# Patient Record
Sex: Male | Born: 1970 | Race: White | Hispanic: No | Marital: Married | Smoking: Never smoker
Health system: Southern US, Community
[De-identification: ages and names within clinical notes are randomized; demographics above are authoritative.]

## PROBLEM LIST (undated history)

## (undated) DIAGNOSIS — R413 Other amnesia: Secondary | ICD-10-CM

## (undated) DIAGNOSIS — I729 Aneurysm of unspecified site: Secondary | ICD-10-CM

## (undated) DIAGNOSIS — Q211 Atrial septal defect: Secondary | ICD-10-CM

## (undated) DIAGNOSIS — F419 Anxiety disorder, unspecified: Secondary | ICD-10-CM

## (undated) DIAGNOSIS — H539 Unspecified visual disturbance: Secondary | ICD-10-CM

## (undated) DIAGNOSIS — Q2112 Patent foramen ovale: Secondary | ICD-10-CM

## (undated) HISTORY — DX: Anxiety disorder, unspecified: F41.9

## (undated) HISTORY — DX: Patent foramen ovale: Q21.12

## (undated) HISTORY — PX: WISDOM TOOTH EXTRACTION: SHX21

## (undated) HISTORY — DX: Atrial septal defect: Q21.1

## (undated) HISTORY — DX: Aneurysm of unspecified site: I72.9

## (undated) HISTORY — DX: Other amnesia: R41.3

## (undated) HISTORY — DX: Unspecified visual disturbance: H53.9

---

## 2007-11-08 ENCOUNTER — Ambulatory Visit (HOSPITAL_COMMUNITY): Admission: RE | Admit: 2007-11-08 | Discharge: 2007-11-08 | Payer: Self-pay | Admitting: Orthopedic Surgery

## 2008-08-24 ENCOUNTER — Ambulatory Visit: Payer: Self-pay | Admitting: Sports Medicine

## 2008-08-24 DIAGNOSIS — M7660 Achilles tendinitis, unspecified leg: Secondary | ICD-10-CM | POA: Insufficient documentation

## 2008-09-30 ENCOUNTER — Ambulatory Visit: Payer: Self-pay | Admitting: Sports Medicine

## 2008-09-30 DIAGNOSIS — M722 Plantar fascial fibromatosis: Secondary | ICD-10-CM

## 2013-06-23 ENCOUNTER — Ambulatory Visit (INDEPENDENT_AMBULATORY_CARE_PROVIDER_SITE_OTHER): Payer: 59 | Admitting: Sports Medicine

## 2013-06-23 ENCOUNTER — Encounter: Payer: Self-pay | Admitting: Sports Medicine

## 2013-06-23 VITALS — BP 130/83 | HR 65 | Ht 72.0 in | Wt 190.0 lb

## 2013-06-23 DIAGNOSIS — M76899 Other specified enthesopathies of unspecified lower limb, excluding foot: Secondary | ICD-10-CM

## 2013-06-23 DIAGNOSIS — M25519 Pain in unspecified shoulder: Secondary | ICD-10-CM

## 2013-06-23 DIAGNOSIS — M6580 Other synovitis and tenosynovitis, unspecified site: Secondary | ICD-10-CM

## 2013-06-23 MED ORDER — NITROGLYCERIN 0.2 MG/HR TD PT24: 0.2000 mg | MEDICATED_PATCH | Freq: Every day | TRANSDERMAL | Status: DC

## 2013-06-23 NOTE — Progress Notes (Signed)
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 He states that his right posterior shoulder pain has been present intermittently for about 2 years. He states that the pain is really only present when he is lifting weights. He notes it is bothered most by chin-ups, horizontal rows, let allowed, and bicep curls with the bar. If he uses free weights he does not get the pain. He is able to do things like bench press and military press and pushups without pain. He notes that when he stopped doing chin-ups his pain improved but returned when he started to do these exercises again. He denies any associated numbness or tingling or pain traveling down the arm.  He also notices some left knee pain when he is lifting weights. He is doing talked squats and feels the pain at the superior pole of his patella at times when doing the squats. It is nontender bother him on his normal day-to-day activities.  ROS: As above in the HPI. All other systems are stable or negative.  OBJECTIVE: APPEARANCE:  Patient in no acute distress.The patient appeared well nourished and normally developed. HEENT: No scleral icterus. Conjunctiva non-injected Resp: Non labored Skin: No rash MSK:  Right Shoulder - No swelling or deformity - No TTP over AC joint, biceps tendon, or lateral humerus - FROM in flexion, abduction, internal, external rotation. He has mild comfort with extreme external rotation as he gets into endrange where he starts to involve his large shoulder muscles and scapular retractors. - Strength 5/5 on shoulder abduction, internal rotation, external rotation, empty can - Negative empty can, Hawkin's, Neer's for impingement - On palpation of the posterior shoulder inferior to the area of the rotator cuff there is a palpable cord of tissue at the location  of the patient's pain that is mildly tender to palpation - Neurovascularly intact  Left Knee - Inspection normal with no erythema or effusion or obvious bony abnormalities.  - Palpation normal with no warmth or joint line tenderness. Mild tenderness at the quad tendon at its insertion onto the superior pole of the patella - ROM normal in flexion and extension. - Strength 5/5 in flexion and extension. - Neurovascularly intact   MSK [place]: Limited ultrasound of the right posterior shoulder was performed today. The teres minor was identified at its approach to the humeral head. As this was followed more inferiorly away from the humeral head there was an area of hyperechoic and thickened tissue suggestive of fibrotic scar tissue lying between the planes of 2 muscles. This is suggestive of tear between the 2 muscle bellies with fibrotic healing. Left quadriceps tendon was also visualized today. There was evidence of multiple hyperechoic calcifications protruding into the quadriceps tendon as well as some spurring off of the superior patella  ASSESSMENT: #1. 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 Right posterior shoulder pain suspect tear of the fascia between muscle bellies with resultant fibrotic scarring  PLAN: #1. For quadriceps tendinopathy, patient was given an exercise program to focus on declined squats. He was also instructed to decrease the weight on his squats and to potentially decrease the depth of his squats to avoid placing additional strain on quadriceps tendon risking rupture. We may need to consider nitroglycerin that in the future if this persists.  #2. For posterior shoulder pain, we  will start him on a nitroglycerin patch. He was asked to modify his exercises to lower weight such that he can start to work on the exercises that previously caused pain but to keep this below a threshold of pain. We will see him back in 6 weeks  for recheck.

## 2013-06-23 NOTE — Patient Instructions (Signed)
Thank you for coming in today  1. Left knee pain - quadriceps calcific tendinopathy - Decrease weight on squats - Keeps squats to 60 degrees - Decline squats 3 x 15  2. Right shoulder pain - it looks like you have torn the tissues between the two muscles and this has healed with excess scar tissue - we want to avoid reinjury - try nitro patch  Nitroglycerin Protocol   Apply 1/4 nitroglycerin patch to affected area daily.  Change position of patch within the affected area every 24 hours.  You may experience a headache during the first 1-2 weeks of using the patch, these should subside.  If you experience headaches after beginning nitroglycerin patch treatment, you may take your preferred over the counter pain reliever.  Another side effect of the nitroglycerin patch is skin irritation or rash related to patch adhesive.  Please notify our office if you develop more severe headaches or rash, and stop the patch.  Tendon healing with nitroglycerin patch may require 12 to 24 weeks depending on the extent of injury.  Men should not use if taking Viagra, Cialis, or Levitra.   Do not use if you have migraines or rosacea.

## 2013-08-17 ENCOUNTER — Ambulatory Visit: Payer: 59 | Admitting: Sports Medicine

## 2013-09-15 ENCOUNTER — Ambulatory Visit (INDEPENDENT_AMBULATORY_CARE_PROVIDER_SITE_OTHER): Payer: 59 | Admitting: Sports Medicine

## 2013-09-15 ENCOUNTER — Encounter: Payer: Self-pay | Admitting: Sports Medicine

## 2013-09-15 VITALS — BP 124/70 | Ht 72.0 in | Wt 190.0 lb

## 2013-09-15 DIAGNOSIS — M76899 Other specified enthesopathies of unspecified lower limb, excluding foot: Secondary | ICD-10-CM

## 2013-09-15 DIAGNOSIS — M25511 Pain in right shoulder: Secondary | ICD-10-CM | POA: Insufficient documentation

## 2013-09-15 DIAGNOSIS — M6580 Other synovitis and tenosynovitis, unspecified site: Secondary | ICD-10-CM

## 2013-09-15 DIAGNOSIS — M25519 Pain in unspecified shoulder: Secondary | ICD-10-CM

## 2013-09-15 NOTE — Progress Notes (Signed)
Patient ID: Dylan Rosario, male   DOB: 05/21/1971, 43 y.o.   MRN: [id number]  Patient was seen for hyperechoic scar tissue in the right infraspinatus thought to be related to certain weight lifting exercises  He modified his weight lifting. He has been using nitroglycerin. Within a week or so of starting nitroglycerin he felt much less pain and has had continued improvement where he now has no pain in the right.  Of interest the left shoulder have some pain over the posterior capsule now  Left quadriceps is now nonpainful. He stopped taking his squats below about 60 and also decrease the weight  With this he has no pain  No acute distress  RT Shoulder: Inspection reveals no abnormalities, atrophy or asymmetry. Palpation is normal with no tenderness over AC joint or bicipital groove. ROM is full in all planes. Rotator cuff strength normal throughout. No signs of impingement with negative Neer and Hawkin's tests, empty can. Speeds and Yergason's tests normal. No labral pathology noted with negative Obrien's, negative clunk and good stability. Normal scapular function observed. No painful arc and no drop arm sign. No apprehension sign  LT Knee: Normal to inspection with no erythema or effusion or obvious bony abnormalities. Palpation normal with no warmth or joint line tenderness or patellar tenderness or condyle tenderness. ROM normal in flexion and extension and lower leg rotation. Ligaments with solid consistent endpoints including ACL, PCL, LCL, MCL. Negative Mcmurray's and provocative meniscal tests. Non painful patellar compression. Patellar and quadriceps tendons unremarkable. Hamstring and quadriceps strength is normal.  Ultrasound The area hypoechoic change in the infraspinatus distal tendon of the right has now resolved The rotator cuff tendons look normal on the right Rotator cuff tendons of left look normal as well  Left knee Quadriceps tendon shows some  calcification in the rectus femoris. There is a fairly significant spur in the vastus lateralis. A smaller spur noted in the vastus medialis. There is much less hypoechoic change on this exam  Right knee shows a significant spur in the rectus femoris tendon -- not in other areas No hypoechoic change

## 2013-09-15 NOTE — Assessment & Plan Note (Signed)
This is not painful at present. I suggested continuing to limit the degree of knee flexion so this does not become symptomatic

## 2013-09-15 NOTE — Assessment & Plan Note (Signed)
In the right shoulder since the infraspinatus now looks normal I think he can wean off the nitroglycerin over the next 2 weeks  Continue to modify his workouts so that he does not get pain along the posterior capsule of the the left or right tendon   His rotator cuff looks healthy on scanning

## 2013-12-20 DIAGNOSIS — D696 Thrombocytopenia, unspecified: Secondary | ICD-10-CM | POA: Insufficient documentation

## 2014-04-27 DIAGNOSIS — R55 Syncope and collapse: Secondary | ICD-10-CM | POA: Insufficient documentation

## 2014-07-20 ENCOUNTER — Other Ambulatory Visit: Payer: 59

## 2014-07-20 ENCOUNTER — Ambulatory Visit (INDEPENDENT_AMBULATORY_CARE_PROVIDER_SITE_OTHER): Payer: 59 | Admitting: Neurology

## 2014-07-20 ENCOUNTER — Encounter: Payer: Self-pay | Admitting: Neurology

## 2014-07-20 VITALS — BP 146/78 | HR 62 | Resp 12 | Ht 73.0 in | Wt 186.2 lb

## 2014-07-20 DIAGNOSIS — R251 Tremor, unspecified: Secondary | ICD-10-CM | POA: Insufficient documentation

## 2014-07-20 DIAGNOSIS — R413 Other amnesia: Secondary | ICD-10-CM | POA: Insufficient documentation

## 2014-07-20 DIAGNOSIS — R42 Dizziness and giddiness: Secondary | ICD-10-CM | POA: Insufficient documentation

## 2014-07-20 NOTE — Progress Notes (Signed)
[facility]  PATIENT: Dylan Rosario DOB: [birth year]  REFERRING DOCTOR OR PCP:  Jesse Fall SOURCE: Patient  _________________________________   HISTORICAL  CHIEF COMPLAINT:  Chief Complaint  Patient presents with  . Memory Loss    [name] is here for eval of  intermittent difficulty with memory, speech, mild tremor bilat hands when trying to grasp something small.  Onset 2014.  Sts. CT head/chest done in [facility] in 2014 was neg. Has not had any other studies./fim    HISTORY OF PRESENT ILLNESS:  I had the pleasure seeing your patient, [name] at [facility] for a neurologic consultation regarding his episode of pre-syncope and memory loss 12/16/2012 and subsequent symptoms. On that day, [place] the beach [place] 3 hours. He recalls that he drank some water while there. On his way back to the resort, he felt a little strange but felt well enough to go to the gym where he started a workout. Halfway through the workout he could not remember what he had done during the first half of the workout which is unusual for him. He stopped and went back to his room. However, he could not remember which room he was in and needed to use GPS to find his way back. Because of those symptoms he decided to go to an urgent care center. While his wife was driving him to the urgent care, he felt lightheaded like he was about to pass out. They sore a fire truck nearby so he had them check his vital signs. His pulse was little elevated and blood pressure was a little reduced. After a little bit longer time he felt the lightheadedness was improved. He went by ambulance to the [facility] emergency room.   He was concerned about a TIA or stroke as his mom had a TIA. While in the ambulance, he had difficulty remembering his address and phone number and other inflormation.   He received IV fluids in the ambulance and first urinated a few hours later.   A head  CT and chest CT were reportedly negative.  In the emergency room, he was told that he might have had transient global amnesia.  When back in town, he saw his PCP office.   He was told BUN was elevated in the ER that episode and he was likely dehydrated.   Three weeks later, he had a short episode of word finding difficulty without other symptoms.    He has noticed a few episodes of tremors in his hands that seemed worse after a bad night's sleep.  He has had a few episodes of lightheadedness when leaning over with head down.   He had several vasovagal episodes with near syncope associated with yawning followed by lightheadedness.  He sat down and felt better.  One episode was while D & C was being explained to wife after miscarriage 10 years ago and two other associated with football and a 1/2 marathon when younger.   He had one other episode after blood drawing.  He feels a little foggy - less cognitively sharp off and on, often after a big meal or if he was very hungry before a meal. .  Episodes lasts 30 minutes and he is then back to baseline  He has occasional nights where he works until 3 am and then still wakes up early.  He is more likely to feel less alert and have hand shaking if that happens.  REVIEW OF SYSTEMS: Constitutional: No fevers, chills, sweats, or change in appetite Eyes: No visual changes, double vision, eye pain Ear, nose and throat: No hearing loss, ear pain, nasal congestion, sore throat Cardiovascular: No chest pain, palpitations Respiratory: No shortness of breath at rest or with exertion.   No wheezes GastrointestinaI: No nausea, vomiting, diarrhea, abdominal pain, fecal incontinence Genitourinary: No dysuria, urinary retention or frequency.  No nocturia. Musculoskeletal: No neck pain, back pain Integumentary: No rash, pruritus, skin lesions Neurological: as above Psychiatric: No depression at this time.  No anxiety Endocrine: No palpitations, diaphoresis,  change in appetite, change in weigh or increased thirst Hematologic/Lymphatic: No anemia, purpura, petechiae. Allergic/Immunologic: No itchy/runny eyes, nasal congestion, recent allergic reactions, rashes  ALLERGIES: Allergies  Allergen Reactions  . Penicillins Diarrhea    HOME MEDICATIONS:  Current outpatient prescriptions:  .  Ascorbic Acid (VITAMIN C) 1000 MG tablet, Take 1,000 mg by mouth., Disp: , Rfl:  .  Desoximetasone 0.05 % GEL, Apply topically Two (2) times a day., Disp: , Rfl:  .  ketoconazole (NIZORAL) 2 % shampoo, as needed., Disp: , Rfl:  .  nitroGLYCERIN (NITRODUR - DOSED IN MG/24 HR) 0.2 mg/hr patch, Place 1 patch (0.2 mg total) onto the skin daily., Disp: 30 patch, Rfl: 1 .  Omega-3 1000 MG CAPS, Take by mouth., Disp: , Rfl:   PAST MEDICAL HISTORY: Past Medical History  Diagnosis Date  . Memory loss   . Vision abnormalities     PAST SURGICAL HISTORY: Past Surgical History  Procedure Laterality Date  . Wisdom tooth extraction      FAMILY HISTORY: Family History  Problem Relation Age of Onset  . Stroke Mother   . Lung cancer Father     SOCIAL HISTORY:  History   Social History  . Marital Status: Married    Spouse Name: N/A  . Number of Children: N/A  . Years of Education: N/A   Occupational History  . Not on file.   Social History Main Topics  . Smoking status: Never Smoker   . Smokeless tobacco: Never Used  . Alcohol Use: 0.0 oz/week    0 Standard drinks or equivalent per week     Comment: 1/2 glass wine daily  . Drug Use: No  . Sexual Activity: Not on file   Other Topics Concern  . Not on file   Social History Narrative     PHYSICAL EXAM  Filed Vitals:   07/20/14 1048  BP: 146/78  Pulse: 62  Resp: 12  Height: 6\' 1"  (1.854 m)  Weight: 186 lb 3.2 oz (84.46 kg)    Body mass index is 24.57 kg/(m^2).   General: The patient is well-developed and well-nourished and in no acute distress  Eyes:  Funduscopic exam shows normal  optic discs and retinal vessels.  Neck: The neck is supple, no carotid bruits are noted.  The neck is nontender.  Cardiovascular: The heart has a regular rate and rhythm with a normal S1 and S2. There were no murmurs, gallops or rubs. Lungs are clear to auscultation.  Skin: Extremities are without significant edema.  Musculoskeletal:  Back is nontender  Neurologic Exam  Mental status: The patient is alert and oriented x 3 at the time of the examination. The patient has apparent normal recent and remote memory, with an apparently normal attention span and concentration ability.   Speech is normal.  Cranial nerves: Extraocular movements are full. Pupils are equal, round, and reactive to light and accomodation.  Visual fields are full.  Facial symmetry is present. There is good facial sensation to soft touch bilaterally.Facial strength is normal.  Trapezius and sternocleidomastoid strength is normal. No dysarthria is noted.  The tongue is midline, and the patient has symmetric elevation of the soft palate. No obvious hearing deficits are noted.  Motor:  Muscle bulk is normal.   Tone is normal. Strength is  5 / 5 in all 4 extremities.   Sensory: Sensory testing is intact to pinprick, soft touch and vibration sensation in all 4 extremities.  Coordination: Cerebellar testing reveals good finger-nose-finger and heel-to-shin bilaterally.  Gait and station: Station is normal.   Gait is normal. Tandem gait is normal. Romberg is negative.   Reflexes: Deep tendon reflexes are symmetric and normal bilaterally.   Plantar responses are flexor.    DIAGNOSTIC DATA (LABS, IMAGING, TESTING) - I reviewed patient records, labs, notes, testing and imaging myself where available.     ASSESSMENT AND PLAN  Memory loss of - Plan: MR Brain W Wo Contrast  Lightheaded - Plan: MR Brain W Wo Contrast  Tremor   In summary, Mr. Darin Engels is a 44 year old man who had an episode in July 2014 of  lightheadedness, memory changes and confusion. Since then he has had several other short episodes of the memory loss, tremors or word finding difficulty. Although he was diagnosed with transient global amnesia, the events of July 2014 do not really resemble that diagnosis. I believe more likely he had presyncope and a confusional episode due to mild dehydration, possible vasovagal reaction and possibly anxiety. Since that he has had several other episodes that are mostly short acting with mild neurologic symptoms. The etiology is not clear but I will go ahead and order an MRI of the brain with and without contrast to make sure that there is not any ischemic or inflammatory or structural explanation. Based on the findings of the MRI, further blood work or other tests may need to be ordered. I will call him back with the results and discuss that with him.  I will see Mr. McCorguodale back as needed and he is advised to call if he has any new or worsening neurologic symptoms.   Thank you very much for asking me to see Mr. McCorguodale for a neurologic consultation. Please let know I can be of any further assistance with him or other patients in the future.   Ludia Gartland A. Felecia Shelling, MD, PhD 2/77/8242, 35:36 AM Certified in Neurology, Clinical Neurophysiology, Sleep Medicine, Pain Medicine and Neuroimaging  [facility] [address], [place], [place] [address] [phone]

## 2014-07-25 ENCOUNTER — Encounter: Payer: Self-pay | Admitting: Family Medicine

## 2014-07-25 ENCOUNTER — Ambulatory Visit (INDEPENDENT_AMBULATORY_CARE_PROVIDER_SITE_OTHER): Payer: 59 | Admitting: Family Medicine

## 2014-07-25 VITALS — BP 119/79 | HR 75 | Ht 73.0 in | Wt 185.0 lb

## 2014-07-25 DIAGNOSIS — M25511 Pain in right shoulder: Secondary | ICD-10-CM

## 2014-07-25 NOTE — Progress Notes (Signed)
Patient ID: Dylan Rosario, male   DOB: [birth year], 44 y.o.   MRN: [id number]  Dylan Rosario - 44 y.o. male MRN [id number]  Date of birth: 15-Jan-1971    SUBJECTIVE:     Right shoulder pain. Very similar to issues he was having last April. At that time he used the nitroglycerin patch and had total resolution of his symptoms. Last week he had normal workout with weights on Thursday, no pain on Friday. Ran Friday. On Saturday morning he awakened with acute right shoulder pain in the posterior part of his shoulder/proximal upper arm. Symptoms are exactly the same, sharp aching. Worse with certain motions. He only differences this time his pain is also at rest. He went to the gym as usual on Sunday and had no pain with the limited workout that day but continues to have some pain at rest and pain with reaching backward and upward. No change in workouts, no change in running. No unusual activities. ROS:     No unusual weight change, fever, sweats, chills. He had no numbness in the right upper extremity. Noted no skin changes, no erythema of the right shoulder joint.  PERTINENT  PMH / PSH FH / / SH:  Past Medical, Surgical, Social, and Family History Reviewed & Updated in the EMR.  Pertinent findings include:  Prior history of similar shoulder pains. Negative personal history diabetes, negative for hypertension, negative for rheumatologic problems. Nonsmoker.  OBJECTIVE: BP 119/79 mmHg  Pulse 75  Ht 6\' 1"  (1.854 m)  Wt 185 lb (83.915 kg)  BMI 24.41 kg/m2  Physical Exam:  Vital signs are reviewed. GEN.: Well-developed male no acute distress Shoulder: Bilaterally they're symmetrical. Very slight amount of pain but no weakness with supraspinatus testing. He also has pain with placement of his hand behind his back and liftoff test. He has well-developed muscle bulk that is symmetrical. SKIN: Skin around the shoulder reveals no sign of lesion, no rash, no warmth. VASCULAR: Radial pulses 2+  bilaterally equal. Normal Refill Neuro: Intact sensation soft touch bilaterally upper extremities and hands. NECK: FROM. Neg Spurlings.  Ultrasound: Shoulder, right. I see no tear in the supraspinatus, subscapularis or teres minor. I do see a hyperechoic area in the mid substance of the infraspinatus. This correlates with what Dr Lelon Huh on the last ultrasound. I'm not sure if this is scar more likely cyst formation from previous injury. The more prominent finding was a linear hyperechoic area within the substance of the infraspinatus close to the border with the teres minor.    ASSESSMENT & PLAN:  See problem based charting & AVS for pt instructions.

## 2014-07-26 NOTE — Assessment & Plan Note (Signed)
Unclear reason for resurgence of symptoms. Nothing has changed in workout by his report. We will retry 2 weeks of NTG patch. If improved, he may f/u PRN. If he has no resolution in 4 weeks then I think he should rtc for further evaluation. I suspect this is related to scar tissue in muscle--although odd that it would return. His neck exam is totally normal but if no improvement I would want to reconsider all origins as etiology of his pain.

## 2014-07-27 ENCOUNTER — Ambulatory Visit (INDEPENDENT_AMBULATORY_CARE_PROVIDER_SITE_OTHER): Payer: 59

## 2014-07-27 ENCOUNTER — Ambulatory Visit: Payer: Self-pay | Admitting: Sports Medicine

## 2014-07-27 DIAGNOSIS — R413 Other amnesia: Secondary | ICD-10-CM

## 2014-07-27 DIAGNOSIS — R42 Dizziness and giddiness: Secondary | ICD-10-CM | POA: Diagnosis not present

## 2014-07-27 MED ORDER — GADOPENTETATE DIMEGLUMINE 469.01 MG/ML IV SOLN: 18.0000 mL | Freq: Once | INTRAVENOUS | Status: AC | PRN

## 2014-07-28 ENCOUNTER — Telehealth: Payer: Self-pay | Admitting: Neurology

## 2014-07-28 DIAGNOSIS — R9089 Other abnormal findings on diagnostic imaging of central nervous system: Secondary | ICD-10-CM

## 2014-07-28 MED ORDER — GADOPENTETATE DIMEGLUMINE 469.01 MG/ML IV SOLN: 18.0000 mL | Freq: Once | INTRAVENOUS | Status: AC | PRN

## 2014-07-28 NOTE — Telephone Encounter (Signed)
Patient questioning if he needs to fast for blood work?  Please call and advise.

## 2014-07-28 NOTE — Telephone Encounter (Signed)
Discussed MRI:    Multiple small subcortical foci.  I want to check some bloodwork (homocysteine, ESR, ANA) and a bubble contrast MRI  We will schedule  He will set up an appt a couple weeks later

## 2014-07-28 NOTE — Telephone Encounter (Signed)
Does not need to fast.

## 2014-07-29 NOTE — Telephone Encounter (Signed)
Spoke with Quillian Quince and advised labwork is not fasting.  He verbalized understanding of same/fim

## 2014-08-08 ENCOUNTER — Other Ambulatory Visit: Payer: Self-pay | Admitting: Neurology

## 2014-08-08 ENCOUNTER — Other Ambulatory Visit: Payer: 59

## 2014-08-08 DIAGNOSIS — R9089 Other abnormal findings on diagnostic imaging of central nervous system: Secondary | ICD-10-CM

## 2014-08-09 LAB — SEDIMENTATION RATE: Sed Rate: 2 mm/hr (ref 0–15)

## 2014-08-10 ENCOUNTER — Telehealth: Payer: Self-pay | Admitting: *Deleted

## 2014-08-10 LAB — ANA W/REFLEX: ANA: NEGATIVE

## 2014-08-10 LAB — HOMOCYSTEINE: HOMOCYSTEINE: 9.7 umol/L (ref 0.0–15.0)

## 2014-08-10 NOTE — Telephone Encounter (Signed)
Spoke with Dylan Rosario and per RAS, advised his labwork looks ok.  He verbalized understanding of same and sts. echo is scheduled for tomorrow./fim

## 2014-08-10 NOTE — Telephone Encounter (Signed)
-----   Message from Britt Bottom, MD sent at 08/10/2014  8:31 AM EDT ----- Labs were fine.     We will see what Echo shows    Has he had it yet

## 2014-08-11 ENCOUNTER — Telehealth: Payer: Self-pay | Admitting: Neurology

## 2014-08-11 ENCOUNTER — Other Ambulatory Visit (HOSPITAL_COMMUNITY): Payer: Self-pay | Admitting: Neurology

## 2014-08-11 ENCOUNTER — Ambulatory Visit (HOSPITAL_COMMUNITY): Payer: 59 | Attending: Neurology | Admitting: Radiology

## 2014-08-11 DIAGNOSIS — I639 Cerebral infarction, unspecified: Secondary | ICD-10-CM | POA: Insufficient documentation

## 2014-08-11 DIAGNOSIS — I253 Aneurysm of heart: Secondary | ICD-10-CM | POA: Diagnosis not present

## 2014-08-11 DIAGNOSIS — Q211 Atrial septal defect: Secondary | ICD-10-CM | POA: Diagnosis not present

## 2014-08-11 NOTE — Progress Notes (Signed)
Echocardiogram performed.  

## 2014-08-11 NOTE — Telephone Encounter (Signed)
Left messsage 08/11/14  450 pm

## 2014-08-12 ENCOUNTER — Telehealth: Payer: Self-pay | Admitting: Neurology

## 2014-08-12 DIAGNOSIS — R42 Dizziness and giddiness: Secondary | ICD-10-CM

## 2014-08-12 DIAGNOSIS — Q2112 Patent foramen ovale: Secondary | ICD-10-CM | POA: Insufficient documentation

## 2014-08-12 DIAGNOSIS — Q211 Atrial septal defect: Secondary | ICD-10-CM | POA: Insufficient documentation

## 2014-08-12 DIAGNOSIS — R9089 Other abnormal findings on diagnostic imaging of central nervous system: Secondary | ICD-10-CM | POA: Insufficient documentation

## 2014-08-12 NOTE — Telephone Encounter (Signed)
I discussed the ECHO results --- PFO is likely cause of MRI changes and possible cause of some of his spells --  ASA 81 mg daily --  We will set up Card's referral for possible symptomatic PFO closure --  Carotid doppler studies

## 2014-08-16 ENCOUNTER — Telehealth: Payer: Self-pay | Admitting: Neurology

## 2014-08-16 DIAGNOSIS — I6782 Cerebral ischemia: Secondary | ICD-10-CM | POA: Insufficient documentation

## 2014-08-16 DIAGNOSIS — G939 Disorder of brain, unspecified: Secondary | ICD-10-CM | POA: Insufficient documentation

## 2014-08-16 NOTE — Telephone Encounter (Signed)
Patient is calling in regard to tests Dr Felecia Shelling has ordered. The Cardiologist's first opening is 4/20 and the doctor stated if Dr Felecia Shelling changed orders to "urgent" they might be able to get him in earlier.  Secondly, the Doppler can be done at the [place] instead of hospital unless Dr Felecia Shelling specifically wants the patient to go to the hospital.  Patient wants to know if Dr. Felecia Shelling wants to change the order as patient has not heard anything from hospital about scheduling as yet. Please call patient.  Thanks!

## 2014-08-17 NOTE — Telephone Encounter (Signed)
Spoke with Quillian Quince and per RAS, advised that 09-14-14 is an acceptable time frame in which to see cardiology.  I have spoken with Collie Siad regarding his carotid doppler and advised Enio that he should expect a call over the next couple of business days to schedule this--that right now Collie Siad is thinking it would be done next Wed. or Thurs.  He verbalized understanding of same/fim

## 2014-08-18 ENCOUNTER — Ambulatory Visit (INDEPENDENT_AMBULATORY_CARE_PROVIDER_SITE_OTHER): Payer: 59

## 2014-08-18 DIAGNOSIS — R42 Dizziness and giddiness: Secondary | ICD-10-CM | POA: Diagnosis not present

## 2014-08-18 DIAGNOSIS — R9089 Other abnormal findings on diagnostic imaging of central nervous system: Secondary | ICD-10-CM

## 2014-08-24 ENCOUNTER — Ambulatory Visit (INDEPENDENT_AMBULATORY_CARE_PROVIDER_SITE_OTHER): Payer: 59 | Admitting: Neurology

## 2014-08-24 ENCOUNTER — Encounter: Payer: Self-pay | Admitting: Neurology

## 2014-08-24 VITALS — BP 124/78 | HR 70 | Resp 14 | Ht 73.0 in | Wt 188.0 lb

## 2014-08-24 DIAGNOSIS — G939 Disorder of brain, unspecified: Secondary | ICD-10-CM | POA: Diagnosis not present

## 2014-08-24 DIAGNOSIS — R55 Syncope and collapse: Secondary | ICD-10-CM

## 2014-08-24 DIAGNOSIS — Q211 Atrial septal defect: Secondary | ICD-10-CM

## 2014-08-24 DIAGNOSIS — I6782 Cerebral ischemia: Secondary | ICD-10-CM

## 2014-08-24 DIAGNOSIS — Q2112 Patent foramen ovale: Secondary | ICD-10-CM

## 2014-08-24 DIAGNOSIS — R9089 Other abnormal findings on diagnostic imaging of central nervous system: Secondary | ICD-10-CM

## 2014-08-24 DIAGNOSIS — R930 Abnormal findings on diagnostic imaging of skull and head, not elsewhere classified: Secondary | ICD-10-CM

## 2014-08-24 NOTE — Progress Notes (Signed)
[facility]  PATIENT: Dylan Rosario DOB: May 25, 1971     HISTORICAL  CHIEF COMPLAINT:  Chief Complaint  Patient presents with  . Dizziness    Here to discuss test results/fim    HISTORY OF PRESENT ILLNESS:  Dylan Rosario is a 44 yo man first seen 07/20/2014 for an episode of pre-syncope and memory loss 12/16/2012 and subsequent symptoms. MRI of the brain a few weeks ago showed multiple small round subcortical foci.  I was concerned about ASD/PFO and ordered ECHO which did show septal wall aneurysm with PFO.   Aspirin was started.    Carotid dopplers also checked and they were normal for age (preliminary).  In the interim, he has started aspirin.   He denies any recent TIA like symptoms.   However, he notes he gets lightheaded sometimes he stands up or when he tilts head up.       He has noted some tinnitus, like background noise.  He notes it more at night.  It is high pitched.    Office visit 07/20/14:    He reported on 12/16/12 that [place] the beach [place] 3 [place]. On his way back to the resort, he felt a little strange but was able to start a gym workout. Halfway through the workout he could not remember what he had done during the first half of the workout.  He went back to his room but needed to use GPS to find his way back. Because of those symptoms he decided to go to an urgent care center. While his wife was driving him to the urgent care, he felt lightheaded like he was about to pass out. Firemen checked vitals.   His pulse was little elevated and blood pressure was a little reduced. He went by ambulance to the [facility] emergency room. While in the ambulance, he had difficulty remembering his address and phone number and other information.   He received IV fluids in the ambulance and first urinated a few hours later.   Labs were c/w dehydration.  A head CT and chest CT were reportedly negative.  In the emergency room, he was told that he might  have had transient global amnesia.    Three weeks later, he had a short episode of word finding difficulty without other symptoms.  He has had a few episodes of lightheadedness when leaning over with head down.   He had several vasovagal episodes with near syncope associated with yawning followed by lightheadedness.  He has had a couple episodes of vasovagal near-syncope.     ROS:  Out of a complete 14 system review of symptoms, the patient complains only of the following symptoms, and all other reviewed systems are negative.  Lightheadedness   ALLERGIES: Allergies  Allergen Reactions  . Penicillins Diarrhea    HOME MEDICATIONS:  Current outpatient prescriptions:  .  Ascorbic Acid (VITAMIN C) 1000 MG tablet, Take 1,000 mg by mouth., Disp: , Rfl:  .  Desoximetasone 0.05 % GEL, Apply topically Two (2) times a day., Disp: , Rfl:  .  ketoconazole (NIZORAL) 2 % shampoo, as needed., Disp: , Rfl:  .  lactobacillus acidophilus (BACID) TABS tablet, Take 1 tablet by mouth daily., Disp: , Rfl:  .  Omega-3 1000 MG CAPS, Take by mouth., Disp: , Rfl:  .  nitroGLYCERIN (NITRODUR - DOSED IN MG/24 HR) 0.2 mg/hr patch, Place 1 patch (0.2 mg total) onto the skin daily. (Patient not taking: Reported on 07/25/2014),  Disp: 30 patch, Rfl: 1  PAST MEDICAL HISTORY: Past Medical History  Diagnosis Date  . Memory loss   . Vision abnormalities     PAST SURGICAL HISTORY: Past Surgical History  Procedure Laterality Date  . Wisdom tooth extraction      FAMILY HISTORY: Family History  Problem Relation Age of Onset  . Stroke Mother   . Lung cancer Father     SOCIAL HISTORY:  History   Social History  . Marital Status: Married    Spouse Name: N/A  . Number of Children: N/A  . Years of Education: N/A   Occupational History  . Not on file.   Social History Main Topics  . Smoking status: Never Smoker   . Smokeless tobacco: Never Used  . Alcohol Use: 0.0 oz/week    0 Standard drinks or  equivalent per week     Comment: 1/2 glass wine daily  . Drug Use: No  . Sexual Activity: Not on file   Other Topics Concern  . Not on file   Social History Narrative     PHYSICAL EXAM  Filed Vitals:   08/24/14 0903  BP: 124/78  Pulse: 70  Resp: 14  Height: 6\' 1"  (1.854 m)  Weight: 188 lb (85.276 kg)    Body mass index is 24.81 kg/(m^2).   General: The patient is well-developed and well-nourished and in no acute distress  Neck: The neck is supple, no carotid bruits are noted.  The neck is nontender.  Skin: Extremities are without significant edema.  Musculoskeletal:  Back is nontender  Neurologic Exam  Mental status: The patient is alert and oriented x 3 at the time of the examination. The patient has apparent normal recent and remote memory, with an apparently normal attention span and concentration ability.   Speech is normal.  Cranial nerves: Extraocular movements are full. Pupils are equal, round, and reactive to light and accomodation.  Visual fields are full.  Facial symmetry is present. There is good facial sensation to soft touch bilaterally.Facial strength is normal.  Trapezius and sternocleidomastoid strength is normal. No dysarthria is noted.  The tongue is midline, and the patient has symmetric elevation of the soft palate.   Motor:  Muscle bulk is normal.   Tone is normal. Strength is  5 / 5 in all 4 extremities.   Sensory: Sensory testing is intact to pinprick, soft touch and vibration sensation in all 4 extremities.  Coordination: Cerebellar testing reveals good finger-nose-finger and heel-to-shin bilaterally.  Gait and station: Station is normal.   Gait is normal. Tandem gait is normal. Romberg is negative.   Reflexes: Deep tendon reflexes are symmetric and normal bilaterally.     DIAGNOSTIC DATA (LABS, IMAGING, TESTING) - I reviewed patient records, labs, notes, testing and imaging myself where available.     ASSESSMENT AND PLAN  PFO with  atrial septal aneurysm - Plan: Hypercoagulable panel, comprehensive  Syncope, unspecified syncope type - Plan: Hypercoagulable panel, comprehensive  Abnormal brain MRI - Plan: Hypercoagulable panel, comprehensive  Temporary cerebral vascular dysfunction   In summary, Dylan Rosario is a 44 year old man who had transient ischemic attack a year ago and was found on MRI to have subcortical foci worrisome for cardio embolic ischemic events. Echocardiogram showed a septal aneurysm with PFO. I had a long discussion with him about the findings, risk of future stroke and risk of other future events. For the time being, I want him to be on aspirin but he is scheduled to  see cardiology because he has a symptomatic PFO, closure may be his best option, long-term. He will discuss this with cardiology when he has his appointment. I will also check a hypercoagulability lab test to make sure that he is not at increased risk  He will return to see me as needed if he has new or worsening neurologic symptoms.  30 minute face-to-face interaction with greater than 50% of the visit counseling and coordinating care about his PFO and neurologic events.   Richard A. Felecia Shelling, MD, PhD 6/46/8032, 1:22 AM Certified in Neurology, Clinical Neurophysiology, Sleep Medicine, Pain Medicine and Neuroimaging  [facility] [address] [place], [place] [address] [phone]

## 2014-09-02 ENCOUNTER — Telehealth: Payer: Self-pay | Admitting: *Deleted

## 2014-09-02 LAB — HYPERCOAGULABLE PANEL, COMPREHENSIVE
ACT. PRT C RESIST W/FV DEFIC.: 2.3 ratio
APTT: 33.4 s
AT III ACT/NOR PPP CHRO: 105 %
Anticardiolipin Ab, IgM: <10 [MPL'U]
Beta-2 Glycoprotein I, IgA: <10 SAU
Beta-2 Glycoprotein I, IgG: <10 SGU
DRVVT SCREEN SECONDS: 49.4 s
FACTOR VIII ACTIVITY: 76 %
Factor VII Antigen**: 89 %
HEXAGONAL PHOSPHOLIPID NEUTRAL: 0 s
HOMOCYSTEINE: 7.7 umol/L
PROT C AG ACT/NOR PPP IMM: 93 %
PROT S AG ACT/NOR PPP IMM: 96 %
Protein C Ag/FVII Ag Ratio**: 1 ratio
Protein S Ag/FVII Ag Ratio**: 1.1 ratio

## 2014-09-02 NOTE — Telephone Encounter (Signed)
-----   Message from Britt Bottom, MD sent at 09/02/2014  8:34 AM EDT ----- Please let him know the hypercoagulability w/u was negative.    Still recommend aspirin daily unless cardiologist suggests otherwise

## 2014-09-02 NOTE — Telephone Encounter (Signed)
Spoke with Quillian Quince and per RAS, advised him of normal labwork and advised he take daily asa unless cardiologist advises not to/fim

## 2014-09-14 ENCOUNTER — Institutional Professional Consult (permissible substitution): Payer: 59 | Admitting: Cardiovascular Disease

## 2014-09-28 ENCOUNTER — Encounter: Payer: Self-pay | Admitting: Cardiovascular Disease

## 2014-09-28 ENCOUNTER — Ambulatory Visit (INDEPENDENT_AMBULATORY_CARE_PROVIDER_SITE_OTHER): Payer: 59 | Admitting: Cardiovascular Disease

## 2014-09-28 VITALS — BP 106/80 | HR 64 | Ht 73.0 in | Wt 185.2 lb

## 2014-09-28 DIAGNOSIS — Q211 Atrial septal defect: Secondary | ICD-10-CM | POA: Diagnosis not present

## 2014-09-28 DIAGNOSIS — Q2112 Patent foramen ovale: Secondary | ICD-10-CM

## 2014-09-28 NOTE — Progress Notes (Signed)
Cardiology Office Note   Date:  09/28/2014   ID:  BLADIMIR AUMAN, DOB 1970/10/07, MRN [id number]  PCP:  Maylon Peppers, MD  Cardiologist:  Sherren Mocha, MD    Chief Complaint  Patient presents with  . New Evaluation    PFO with Atrial Septal Aneurysm, light headed, abnormal MRI    History of Present Illness: Dylan Rosario is a 44 y.o. male who presents for evaluation of atrial septal communication. In 2014 he had sudden onset of memory impairment and near-syncope. He was evaluated in the Emergency Department and was diagnosed with a TIA. Transient global amnesia was also considered but he didn't have a true gap in memory. He ultimately underwent an MRI of the brain which was suggestive of possible cardiac source of embolus. There were multiple foci identified in the subcortical matter.  The patient has had no further episodes of memory loss, expressive aphasia, or focal neurologic deficit. He denies chest pain, shortness of breath, leg swelling, or heart palpitations. He does experience occasional episodes of dizziness with postural changes. The patient has started taking aspirin.  He has been evaluated by Dr Felecia Shelling with Neurology. He has undergone a hypercoagulable workup and this is found to be negative. He was advised to continue aspirin and seek further evaluation for PFO with associated atrial septal aneurysm. This was demonstrated on an echocardiogram with bubble study.  The patient exercises vigorously. He does heavy weight lifting and also runs. He has no exertional symptoms. Has done extensive reading about PFO and has many questions today   Past Medical History  Diagnosis Date  . Memory loss   . Vision abnormalities     Past Surgical History  Procedure Laterality Date  . Wisdom tooth extraction      Current Outpatient Prescriptions  Medication Sig Dispense Refill  . Ascorbic Acid (VITAMIN C) 1000 MG tablet Take 1,000 mg by mouth.    Marland Kitchen aspirin 81 MG  tablet Take 81 mg by mouth daily.    . Desoximetasone 0.05 % GEL Apply topically Two (2) times a day.    . ketoconazole (NIZORAL) 2 % shampoo as needed.    . lactobacillus acidophilus (BACID) TABS tablet Take 1 tablet by mouth daily.    . nitroGLYCERIN (NITRODUR - DOSED IN MG/24 HR) 0.2 mg/hr patch Place 1 patch (0.2 mg total) onto the skin daily. 30 patch 1  . Omega-3 1000 MG CAPS Take by mouth.     No current facility-administered medications for this visit.    Allergies:   Penicillins   Social History:  The patient  reports that he has never smoked. He has never used smokeless tobacco. He reports that he drinks alcohol. He reports that he does not use illicit drugs.   Family History:  The patient's family history includes Lung cancer in his father; Stroke in his mother.    ROS:  Please see the history of present illness.  Otherwise, review of systems is positive for dizziness.  All other systems are reviewed and negative.    PHYSICAL EXAM: VS:  BP 106/80 mmHg  Pulse 64  Ht 6\' 1"  (1.854 m)  Wt 185 lb 3.2 oz (84.006 kg)  BMI 24.44 kg/m2 , BMI Body mass index is 24.44 kg/(m^2). GEN: Well nourished, well developed, physically fit male in no acute distress HEENT: normal Neck: no JVD, no masses. No carotid bruits Cardiac: RRR without murmur or gallop  Respiratory:  clear to auscultation bilaterally, normal work of breathing GI: soft, nontender, nondistended, + BS MS: no deformity or atrophy Ext: no pretibial edema, pedal pulses 2+= bilaterally Skin: warm and dry, no rash Neuro:  Strength and sensation are intact Psych: euthymic mood, full affect  EKG:  EKG is ordered today. The ekg ordered today shows NSR, within normal limits  Recent Labs: No results found for requested labs within last 365 days.   Lipid Panel  No results found for: CHOL, TRIG, HDL, CHOLHDL, VLDL, LDLCALC, LDLDIRECT    Wt Readings from Last 3 Encounters:  09/28/14 185 lb 3.2 oz (84.006 kg)   08/24/14 188 lb (85.276 kg)  07/25/14 185 lb (83.915 kg)     Cardiac Studies Reviewed: 2D Echo 3.17.2016: Study Conclusions  - Left ventricle: The cavity size was normal. Wall thickness was normal. Systolic function was normal. The estimated ejection fraction was in the range of 60% to 65%. Wall motion was normal; there were no regional wall motion abnormalities. Doppler parameters are consistent with abnormal left ventricular relaxation (grade 1 diastolic dysfunction). The E/e&' ratio is <8, suggesting normal LV filling pressure. - Left atrium: The atrium was normal in size. - Atrial septum: Aneurysmal IAS. There is a moderate-sized PFO with right to left shunting by saline microbubble contrast.  Impressions:  - LVEF 60-65%, normal wall thickness, normal wall motion, normal chamber sizes, diastolic dysfunction with normal LV filling pressure. Atrial septal aneurysm with moderate-sized PFO and right to left shunting by saline microbubble contrast.  MRI Brain 3.2.2016: IMPRESSION: This is an abnormal MRI of the brain with and without contrast showing around foci, predominantly in the subcortical matter. This is a nonspecific finding and could be idiopathic or due to small vessel ischemic change, cardi- emboli or be sequela of prior inflammation or trauma. Vasculitis and demyelination are less likely to give this pattern. Further clinical correlation is suggested.  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 The patient has a clinical history and brain MRI scan suggestive of a cardiac source of embolism. He otherwise has no risk factors for TIA. He is physically fit without history of hypertension, hyperlipidemia, diabetes, or family history of cardiovascular disease. He is a nonsmoker. He had no evidence of atherosclerosis based on duplex scans.  I have personally reviewed his echo study with agitated saline. I do not appreciate any evidence  of LV dysfunction or valvular disease. There are bubbles seen in the left heart, but I cannot identify the level of shunt. He does have evidence of atrial septal aneurysm with a mobile interatrial septum.   We have had a lengthy discussion about the role of PFO and cryptogenic stroke, especially in the context of an atrial septal aneurysm. The patient understands that there is an association, especially in younger patients. We also reviewed randomized controlled trial data and evidence trended toward reduction in recurrent events, especially with longer term follow-up. However, I also made it clear to him that there was not statistically significant benefit seen in the entire patient cohort. We reviewed management strategies which include antiplatelet therapy with aspirin versus transcatheter closure. We reviewed specific details of transcatheter closure, including potential risks and benefits. At this point, I think it would be helpful to identify the specific anatomy of this patient's interatrial septum. It is possible that he has atypical PFO, a small atrial septal defect, or even a different level of shunt such as pulmonary AVM. A TEE would provide anatomic information in this  regard. I have reviewed the risks, indications, and alternatives to TEE with the patient. He understands and agrees to proceed. This will be scheduled next week with Dr. Aundra Dubin. I will see him back in close follow-up after the TEE is completed and we can have further discussion about potential treatment options at that point.   Current medicines are reviewed with the patient today.  The patient does not have concerns regarding medicines.  Labs/ tests ordered today include:  No orders of the defined types were placed in this encounter.   Disposition:   FU as directed  Time spent conducting this evaluation is 90 minutes, with 60 spent in direct discussion with the patient as outlined above.   Deatra James, MD    09/28/2014 11:16 AM    [address], [place], [place]  [address] Phone: [phone]; Fax: [phone]

## 2014-09-28 NOTE — Patient Instructions (Addendum)
Medication Instructions:  Your physician recommends that you continue on your current medications as directed. Please refer to the Current Medication list given to you today.  Labwork: No new orders.  Testing/Procedures: Your physician has requested that you have a TEE. During a TEE, sound waves are used to create images of your heart. It provides your doctor with information about the size and shape of your heart and how well your heart's chambers and valves are working. In this test, a transducer is attached to the end of a flexible tube that's guided down your throat and into your esophagus (the tube leading from you mouth to your stomach) to get a more detailed image of your heart. You are not awake for the procedure. Please see the instruction sheet given to you today. For further information please visit [email or web address].  Follow-Up: Your physician recommends that you schedule a follow-up appointment after TEE with Dr Burt Knack   Any Other Special Instructions Will Be Listed Below (If Applicable).

## 2014-09-29 ENCOUNTER — Other Ambulatory Visit: Payer: Self-pay | Admitting: Cardiovascular Disease

## 2014-09-29 DIAGNOSIS — Q211 Atrial septal defect: Secondary | ICD-10-CM

## 2014-09-29 DIAGNOSIS — I253 Aneurysm of heart: Secondary | ICD-10-CM

## 2014-09-30 ENCOUNTER — Encounter: Payer: Self-pay | Admitting: Cardiovascular Disease

## 2014-10-06 ENCOUNTER — Encounter (HOSPITAL_COMMUNITY): Payer: Self-pay | Admitting: *Deleted

## 2014-10-06 ENCOUNTER — Ambulatory Visit (HOSPITAL_COMMUNITY): Payer: 59

## 2014-10-06 ENCOUNTER — Encounter (HOSPITAL_COMMUNITY)
Admission: RE | Disposition: A | Discharge status: Home / Self Care | Payer: Self-pay | Source: Ambulatory Visit | Attending: Cardiology

## 2014-10-06 ENCOUNTER — Ambulatory Visit (HOSPITAL_COMMUNITY)
Admission: RE | Admit: 2014-10-06 | Discharge: 2014-10-06 | Disposition: A | Discharge status: Home / Self Care | Payer: 59 | Source: Ambulatory Visit | Attending: Cardiology | Admitting: Cardiology

## 2014-10-06 DIAGNOSIS — Z7982 Long term (current) use of aspirin: Secondary | ICD-10-CM | POA: Diagnosis not present

## 2014-10-06 DIAGNOSIS — Q211 Atrial septal defect: Secondary | ICD-10-CM

## 2014-10-06 DIAGNOSIS — I253 Aneurysm of heart: Secondary | ICD-10-CM | POA: Insufficient documentation

## 2014-10-06 DIAGNOSIS — Z880 Allergy status to penicillin: Secondary | ICD-10-CM | POA: Insufficient documentation

## 2014-10-06 HISTORY — PX: TEE WITHOUT CARDIOVERSION: SHX5443

## 2014-10-06 SURGERY — ECHOCARDIOGRAM, TRANSESOPHAGEAL
Anesthesia: Moderate Sedation

## 2014-10-06 MED ORDER — SODIUM CHLORIDE 0.9 % IV SOLN
INTRAVENOUS | Status: DC
  Administered 2014-10-06: 500 mL via INTRAVENOUS

## 2014-10-06 MED ORDER — MIDAZOLAM HCL 5 MG/ML IJ SOLN
INTRAMUSCULAR | Status: AC
  Filled 2014-10-06: qty 2

## 2014-10-06 MED ORDER — FENTANYL CITRATE (PF) 100 MCG/2ML IJ SOLN
INTRAMUSCULAR | Status: DC | PRN
  Administered 2014-10-06: 50 ug via INTRAVENOUS
  Administered 2014-10-06: 25 ug via INTRAVENOUS

## 2014-10-06 MED ORDER — FENTANYL CITRATE (PF) 100 MCG/2ML IJ SOLN
INTRAMUSCULAR | Status: AC
  Filled 2014-10-06: qty 2

## 2014-10-06 MED ORDER — MIDAZOLAM HCL 10 MG/2ML IJ SOLN
INTRAMUSCULAR | Status: DC | PRN
  Administered 2014-10-06: 1 mg via INTRAVENOUS
  Administered 2014-10-06 (×2): 2 mg via INTRAVENOUS

## 2014-10-06 MED ORDER — DIPHENHYDRAMINE HCL 50 MG/ML IJ SOLN
INTRAMUSCULAR | Status: DC | PRN
  Administered 2014-10-06: 50 mg via INTRAVENOUS

## 2014-10-06 MED ORDER — DIPHENHYDRAMINE HCL 50 MG/ML IJ SOLN
INTRAMUSCULAR | Status: AC
  Filled 2014-10-06: qty 1

## 2014-10-06 NOTE — CV Procedure (Signed)
TEE:  5 mg versed 50 mg benedryl  75 ug fentanly  Normal RV/LV function EF 65% Normal atria Normal valves No evidence of anomalous PV;s althouth RLPV not well seen Atrial septal aneurysm.  PFO not visualized by 2D or color flow.  Not fenestrated Positive bubble study but mostly with cough and valsalva No LAA thrombus No aortic debris Normal SVC No effusion  Dylan Rosario

## 2014-10-06 NOTE — Interval H&P Note (Signed)
PFO with TIA  Requested TEE by Dr Burt Knack

## 2014-10-06 NOTE — Discharge Instructions (Signed)
Transesophageal Echocardiogram Transesophageal echocardiography (TEE) is a picture test of your heart using sound waves. The pictures taken can give very detailed pictures of your heart. This can help your doctor see if there are problems with your heart. TEE can check:  If your heart has blood clots in it.  How well your heart valves are working.  If you have an infection on the inside of your heart.  Some of the major arteries of your heart.  If your heart valve is working after a Office manager.  Your heart before a procedure that uses a shock to your heart to get the rhythm back to normal. BEFORE THE PROCEDURE  Do not eat or drink for 6 hours before the procedure or as told by your doctor.  Make plans to have someone drive you home after the procedure. Do not drive yourself home.  An IV tube will be put in your arm. PROCEDURE  You will be given a medicine to help you relax (sedative). It will be given through the IV tube.  A numbing medicine will be sprayed or gargled in the back of your throat to help numb it.  The tip of the probe is placed into the back of your mouth. You will be asked to swallow. This helps to pass the probe into your esophagus.  Once the tip of the probe is in the right place, your doctor can take pictures of your heart.  You may feel pressure at the back of your throat. AFTER THE PROCEDURE  You will be taken to a recovery area so the sedative can wear off.  Your throat may be sore and scratchy. This will go away slowly over time.  You will go home when you are fully awake and able to swallow liquids.  You should have someone stay with you for the next 24 hours.  Do not drive or operate machinery for the next 24 hours. Document Released: 03/10/2009 Document Revised: 05/18/2013 Document Reviewed: 11/12/2012 [facility] Patient Information 2015 [place], [place]. This information is not intended to replace advice given to you by your health care provider. Make  sure you discuss any questions you have with your health care provider.  F/U Dr Burt Knack next Friday No valsalva or heavy weight lifting  Avoid straining when going to bathroom

## 2014-10-06 NOTE — H&P (View-Only) (Signed)
Cardiology Office Note   Date:  09/28/2014   ID:  Dylan Rosario, DOB September 19, 1970, MRN [id number]  PCP:  Dylan Peppers, MD  Cardiologist:  Dylan Mocha, MD    Chief Complaint  Patient presents with  . New Evaluation    PFO with Atrial Septal Aneurysm, light headed, abnormal MRI    History of Present Illness: Dylan Rosario is a 44 y.o. male who presents for evaluation of atrial septal communication. In 2014 he had sudden onset of memory impairment and near-syncope. He was evaluated in the Emergency Department and was diagnosed with a TIA. Transient global amnesia was also considered but he didn't have a true gap in memory. He ultimately underwent an MRI of the brain which was suggestive of possible cardiac source of embolus. There were multiple foci identified in the subcortical matter.  The patient has had no further episodes of memory loss, expressive aphasia, or focal neurologic deficit. He denies chest pain, shortness of breath, leg swelling, or heart palpitations. He does experience occasional episodes of dizziness with postural changes. The patient has started taking aspirin.  He has been evaluated by Dr Felecia Shelling with Neurology. He has undergone a hypercoagulable workup and this is found to be negative. He was advised to continue aspirin and seek further evaluation for PFO with associated atrial septal aneurysm. This was demonstrated on an echocardiogram with bubble study.  The patient exercises vigorously. He does heavy weight lifting and also runs. He has no exertional symptoms. Has done extensive reading about PFO and has many questions today   Past Medical History  Diagnosis Date  . Memory loss   . Vision abnormalities     Past Surgical History  Procedure Laterality Date  . Wisdom tooth extraction      Current Outpatient Prescriptions  Medication Sig Dispense Refill  . Ascorbic Acid (VITAMIN C) 1000 MG tablet Take 1,000 mg by mouth.    Marland Kitchen aspirin 81 MG  tablet Take 81 mg by mouth daily.    . Desoximetasone 0.05 % GEL Apply topically Two (2) times a day.    . ketoconazole (NIZORAL) 2 % shampoo as needed.    . lactobacillus acidophilus (BACID) TABS tablet Take 1 tablet by mouth daily.    . nitroGLYCERIN (NITRODUR - DOSED IN MG/24 HR) 0.2 mg/hr patch Place 1 patch (0.2 mg total) onto the skin daily. 30 patch 1  . Omega-3 1000 MG CAPS Take by mouth.     No current facility-administered medications for this visit.    Allergies:   Penicillins   Social History:  The patient  reports that he has never smoked. He has never used smokeless tobacco. He reports that he drinks alcohol. He reports that he does not use illicit drugs.   Family History:  The patient's family history includes Lung cancer in his father; Stroke in his mother.    ROS:  Please see the history of present illness.  Otherwise, review of systems is positive for dizziness.  All other systems are reviewed and negative.    PHYSICAL EXAM: VS:  BP 106/80 mmHg  Pulse 64  Ht 6\' 1"  (1.854 m)  Wt 185 lb 3.2 oz (84.006 kg)  BMI 24.44 kg/m2 , BMI Body mass index is 24.44 kg/(m^2). GEN: Well nourished, well developed, physically fit male in no acute distress HEENT: normal Neck: no JVD, no masses. No carotid bruits Cardiac: RRR without murmur or gallop  Respiratory:  clear to auscultation bilaterally, normal work of breathing GI: soft, nontender, nondistended, + BS MS: no deformity or atrophy Ext: no pretibial edema, pedal pulses 2+= bilaterally Skin: warm and dry, no rash Neuro:  Strength and sensation are intact Psych: euthymic mood, full affect  EKG:  EKG is ordered today. The ekg ordered today shows NSR, within normal limits  Recent Labs: No results found for requested labs within last 365 days.   Lipid Panel  No results found for: CHOL, TRIG, HDL, CHOLHDL, VLDL, LDLCALC, LDLDIRECT    Wt Readings from Last 3 Encounters:  09/28/14 185 lb 3.2 oz (84.006 kg)   08/24/14 188 lb (85.276 kg)  07/25/14 185 lb (83.915 kg)     Cardiac Studies Reviewed: 2D Echo 3.17.2016: Study Conclusions  - Left ventricle: The cavity size was normal. Wall thickness was normal. Systolic function was normal. The estimated ejection fraction was in the range of 60% to 65%. Wall motion was normal; there were no regional wall motion abnormalities. Doppler parameters are consistent with abnormal left ventricular relaxation (grade 1 diastolic dysfunction). The E/e&' ratio is <8, suggesting normal LV filling pressure. - Left atrium: The atrium was normal in size. - Atrial septum: Aneurysmal IAS. There is a moderate-sized PFO with right to left shunting by saline microbubble contrast.  Impressions:  - LVEF 60-65%, normal wall thickness, normal wall motion, normal chamber sizes, diastolic dysfunction with normal LV filling pressure. Atrial septal aneurysm with moderate-sized PFO and right to left shunting by saline microbubble contrast.  MRI Brain 3.2.2016: IMPRESSION: This is an abnormal MRI of the brain with and without contrast showing around foci, predominantly in the subcortical matter. This is a nonspecific finding and could be idiopathic or due to small vessel ischemic change, cardi- emboli or be sequela of prior inflammation or trauma. Vasculitis and demyelination are less likely to give this pattern. Further clinical correlation is suggested.  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 The patient has a clinical history and brain MRI scan suggestive of a cardiac source of embolism. He otherwise has no risk factors for TIA. He is physically fit without history of hypertension, hyperlipidemia, diabetes, or family history of cardiovascular disease. He is a nonsmoker. He had no evidence of atherosclerosis based on duplex scans.  I have personally reviewed his echo study with agitated saline. I do not appreciate any evidence  of LV dysfunction or valvular disease. There are bubbles seen in the left heart, but I cannot identify the level of shunt. He does have evidence of atrial septal aneurysm with a mobile interatrial septum.   We have had a lengthy discussion about the role of PFO and cryptogenic stroke, especially in the context of an atrial septal aneurysm. The patient understands that there is an association, especially in younger patients. We also reviewed randomized controlled trial data and evidence trended toward reduction in recurrent events, especially with longer term follow-up. However, I also made it clear to him that there was not statistically significant benefit seen in the entire patient cohort. We reviewed management strategies which include antiplatelet therapy with aspirin versus transcatheter closure. We reviewed specific details of transcatheter closure, including potential risks and benefits. At this point, I think it would be helpful to identify the specific anatomy of this patient's interatrial septum. It is possible that he has atypical PFO, a small atrial septal defect, or even a different level of shunt such as pulmonary AVM. A TEE would provide anatomic information in this  regard. I have reviewed the risks, indications, and alternatives to TEE with the patient. He understands and agrees to proceed. This will be scheduled next week with Dr. Aundra Dubin. I will see him back in close follow-up after the TEE is completed and we can have further discussion about potential treatment options at that point.   Current medicines are reviewed with the patient today.  The patient does not have concerns regarding medicines.  Labs/ tests ordered today include:  No orders of the defined types were placed in this encounter.   Disposition:   FU as directed  Time spent conducting this evaluation is 90 minutes, with 60 spent in direct discussion with the patient as outlined above.   Deatra James, MD    09/28/2014 11:16 AM    [address], [place], [place]  [address] Phone: [phone]; Fax: [phone]

## 2014-10-06 NOTE — Progress Notes (Signed)
  Echocardiogram Echocardiogram Transesophageal has been performed.  [name] 10/06/2014, 9:41 AM

## 2014-10-07 ENCOUNTER — Encounter (HOSPITAL_COMMUNITY): Payer: Self-pay | Admitting: Cardiovascular Disease

## 2014-10-13 ENCOUNTER — Encounter: Payer: Self-pay | Admitting: Cardiovascular Disease

## 2014-10-14 ENCOUNTER — Ambulatory Visit: Payer: 59 | Admitting: Cardiovascular Disease

## 2014-10-18 ENCOUNTER — Encounter: Payer: Self-pay | Admitting: Cardiovascular Disease

## 2014-10-21 ENCOUNTER — Encounter: Payer: Self-pay | Admitting: Cardiovascular Disease

## 2014-10-21 ENCOUNTER — Ambulatory Visit (INDEPENDENT_AMBULATORY_CARE_PROVIDER_SITE_OTHER): Payer: 59 | Admitting: Cardiovascular Disease

## 2014-10-21 VITALS — BP 120/60 | HR 68 | Ht 72.0 in | Wt 187.6 lb

## 2014-10-21 DIAGNOSIS — Q2112 Patent foramen ovale: Secondary | ICD-10-CM

## 2014-10-21 DIAGNOSIS — Q211 Atrial septal defect: Secondary | ICD-10-CM | POA: Diagnosis not present

## 2014-10-21 NOTE — Patient Instructions (Signed)
Medication Instructions:  Your physician recommends that you continue on your current medications as directed. Please refer to the Current Medication list given to you today.  Labwork: No new orders.   Testing/Procedures: No new orders.   Follow-Up: Your physician recommends that you schedule a follow-up appointment as needed with Dr Burt Knack.    Any Other Special Instructions Will Be Listed Below (If Applicable).  We will forward a copy of your TEE to Dr Aline Brochure at [facility].

## 2014-10-22 ENCOUNTER — Encounter: Payer: Self-pay | Admitting: Cardiovascular Disease

## 2014-10-22 NOTE — Progress Notes (Signed)
Cardiology Office Note   Date:  10/22/2014   ID:  Dylan Rosario, DOB 12/01/1970, MRN [id number]  PCP:  Maylon Peppers, MD  Cardiologist:  Sherren Mocha, MD    Chief Complaint  Patient presents with  . OTHER    PFO   History of Present Illness: Dylan Rosario is a 44 y.o. 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 In 2014 he had sudden onset of memory impairment and near-syncope. He was evaluated in the Emergency Department and was diagnosed with a TIA. Transient global amnesia was also considered but he didn't have a true gap in memory. He ultimately underwent an MRI of the brain which was suggestive of possible cardiac source of embolus. There were multiple foci identified in the subcortical matter. A hypercoagulable study was negative. Echo with bubble study demonstrated findings suggestive of PFO with atrial septal aneurysm. He was seen initially 09/28/2014 and a TEE was arranged. This demonstrated an aneurysmal atrial septum without clear evidence of a PFO. There was no color-flow across the septum. However, there was a positive agitated saline study with cough and Valsalva. There was not a large shunt seen.  He reports no problems since I last saw him. No neurologic symptoms.  Past Medical History  Diagnosis Date  . Memory loss   . Vision abnormalities     Past Surgical History  Procedure Laterality Date  . Wisdom tooth extraction    . Tee without cardioversion N/A 10/06/2014    Procedure: TRANSESOPHAGEAL ECHOCARDIOGRAM (TEE);  Surgeon: Josue Hector, MD;  Location: [facility];  Service: Cardiovascular;  Laterality: N/A;    Current Outpatient Prescriptions  Medication Sig Dispense Refill  . Ascorbic Acid (VITAMIN C) 1000 MG tablet Take 1,000 mg by mouth.    Marland Kitchen aspirin 81 MG tablet Take 81 mg by mouth daily.    . Desoximetasone 0.05 % GEL Apply topically Two (2) times a day.    . ketoconazole (NIZORAL) 2 % shampoo as needed.    .  lactobacillus acidophilus (BACID) TABS tablet Take 1 tablet by mouth daily.    . Omega-3 1000 MG CAPS Take 1,000 mg by mouth 2 (two) times daily.      No current facility-administered medications for this visit.    Allergies:   Penicillins   Social History:  The patient  reports that he has never smoked. He has never used smokeless tobacco. He reports that he drinks alcohol. He reports that he does not use illicit drugs.   Family History:  The patient's family history includes Lung cancer in his father; Stroke in his mother.   PHYSICAL EXAM: VS:  BP 120/60 mmHg  Pulse 68  Ht 6' (1.829 m)  Wt 187 lb 9.6 oz (85.095 kg)  BMI 25.44 kg/m2 , BMI Body mass index is 25.44 kg/(m^2). Exam not performed today - see recent exam.  EKG:  EKG is not ordered today.  Recent Labs: No results found for requested labs within last 365 days.   Lipid Panel  No results found for: CHOL, TRIG, HDL, CHOLHDL, VLDL, LDLCALC, LDLDIRECT    Wt Readings from Last 3 Encounters:  10/21/14 187 lb 9.6 oz (85.095 kg)  09/28/14 185 lb 3.2 oz (84.006 kg)  08/24/14 188 lb (85.276 kg)    Cardiac Studies Reviewed: TEE: Study Conclusions  - Impressions: Normal EF 60% Normal valves No aortic debris No effusion No LAA thrombus  Redundant and aneurysmal atrial septum No fenestrations. PFO not seen  by 2D and color. However bubble study positive x2 with coughing and valsalva. No obvious sinus venosis defect or anomalous veins although the right nferior PV not well seen.  This would not be typical of lesion that wouild be thought to be concerning for CNS events  ASSESSMENT AND PLAN: Probably PFO with atrial septal aneurysm. TEE images reviewed. I suspect he has a small PFO. There is a small right-to-left shunt with agitated saline. Discussed findings at length with the patient. He has many questions about prognosis, restrictions, and recommendations. I tried to answer all of his questions  to the best of my knowledge. I think he is at low of recurrent TIA as long as he stays on ASA. Because the defect appears to be very small, I would not recommend pursuing device closure. I will send his TEE to Dr Aline Brochure at [facility] to get his opinion. He has a great deal of experience with PFO closure and the patient would like to explore all options. The patient participates in some extreme weight lifting and while I don't think exercise needs to be restricted, I advised against power lifting. We also discussed discussion of a Chest CT for completeness to rule out pulmonary AVM's (since PFO not well seen on TEE). Will defer for now as yield is low.  Current medicines are reviewed with the patient today.  The patient does not have concerns regarding medicines.  Labs/ tests ordered today include:  No orders of the defined types were placed in this encounter.    Disposition:   FU as needed. Will contact patient after I review with Dr Aline Brochure.  Deatra James, MD  10/22/2014 4:38 PM    [address], [place], [place]  [address] Phone: [phone]; Fax: [phone]

## 2014-10-24 ENCOUNTER — Encounter: Payer: Self-pay | Admitting: Cardiovascular Disease

## 2014-11-21 ENCOUNTER — Institutional Professional Consult (permissible substitution): Payer: 59 | Admitting: Cardiovascular Disease

## 2014-11-22 ENCOUNTER — Telehealth: Payer: Self-pay | Admitting: Cardiovascular Disease

## 2014-11-22 NOTE — Telephone Encounter (Signed)
I spoke to Dr Aline Brochure at [facility] who kindly reviewed the patient's case and imaging studies. He agreed that Dylan Rosario has a PFO with transient shunting during valsalva. He also agreed that in setting of clinical and MRI findings, he would favor antiplatelet Rx with ASA and would reserve PFO closure for recurrence.   Reviewed those recommendations with the patient who understood. All questions answered. He will call with further questions.  Sherren Mocha 11/22/2014 3:57 PM

## 2015-01-26 ENCOUNTER — Encounter: Payer: Self-pay | Admitting: Cardiovascular Disease

## 2015-04-17 ENCOUNTER — Encounter: Payer: Self-pay | Admitting: Cardiovascular Disease

## 2015-05-09 DIAGNOSIS — R531 Weakness: Secondary | ICD-10-CM | POA: Insufficient documentation

## 2015-05-09 DIAGNOSIS — M6281 Muscle weakness (generalized): Secondary | ICD-10-CM | POA: Insufficient documentation

## 2015-09-28 ENCOUNTER — Ambulatory Visit (INDEPENDENT_AMBULATORY_CARE_PROVIDER_SITE_OTHER): Payer: 59 | Admitting: Neurology

## 2015-09-28 ENCOUNTER — Encounter: Payer: Self-pay | Admitting: Neurology

## 2015-09-28 VITALS — BP 128/74 | HR 66 | Resp 12 | Ht 72.0 in | Wt 179.5 lb

## 2015-09-28 DIAGNOSIS — R55 Syncope and collapse: Secondary | ICD-10-CM

## 2015-09-28 DIAGNOSIS — Q211 Atrial septal defect: Secondary | ICD-10-CM | POA: Diagnosis not present

## 2015-09-28 DIAGNOSIS — G3184 Mild cognitive impairment, so stated: Secondary | ICD-10-CM | POA: Diagnosis not present

## 2015-09-28 DIAGNOSIS — R413 Other amnesia: Secondary | ICD-10-CM

## 2015-09-28 DIAGNOSIS — R9089 Other abnormal findings on diagnostic imaging of central nervous system: Secondary | ICD-10-CM

## 2015-09-28 DIAGNOSIS — E559 Vitamin D deficiency, unspecified: Secondary | ICD-10-CM

## 2015-09-28 DIAGNOSIS — Q2112 Patent foramen ovale: Secondary | ICD-10-CM

## 2015-09-28 DIAGNOSIS — R930 Abnormal findings on diagnostic imaging of skull and head, not elsewhere classified: Secondary | ICD-10-CM

## 2015-09-28 NOTE — Progress Notes (Signed)
[facility]  PATIENT: Dylan Rosario DOB: [birth year]     HISTORICAL  CHIEF COMPLAINT:  Chief Complaint  Patient presents with  . Dizziness    Has seen cardiology for PFO and it was determined it was too small to close.  Sts. he has not had any real issues with dizziness.  He is back in today to discuss memory, mri brain results/fim  . PFO  . Memory Loss    HISTORY OF PRESENT ILLNESS:  Dylan Rosario is a 45 yo man first seen 07/20/2014 for an episode of pre-syncope and memory loss 12/16/2012 and subsequent symptoms. MRI of the brain showed multiple small round subcortical foci.  I was concerned about ASD/PFO and ordered ECHO which did show septal wall aneurysm with PFO.   Aspirin was started.    Carotid dopplers also checked and they were normal for age (preliminary).   He did not have additional symptoms. He was evaluated by cardiology. Due to the minimal TEE findings, it was decided that PFO closure was not indicated.  Memory:   He feels his short term memory is worse the past year.    He works as a Financial planner and notes subtle slowing cognitively but rarely makes mistakes.   Multitasking seems more difficulty.   Brain MRI 4/17 showed the same T2/FLAIR hyperintense foci but parietal atrophy was also noted.   I personally compared the 2016 of the 2017 MRIs side-by-side. In 2016, there was some parietal atrophy though so side do appear to be mildly larger on the 2017 study. Additionally, the third ventricle is mildly larger on the more recent study. Therefore, it is possible that there has been some change in the extent of atrophy over the past year. The mesial temporal lobes appear normal in both studies.  He has some snoring and wife has commented about occasional snorts or gasps while he is asleep.   He occasionally dozes off in the evening if he is laying down. However, he has not has sleepiness at work  He notes some anxiety but denies depression.    He does not have any known vitamin deficiency, thyroid disease or other significant medical issues.  His mother had dementia in her late 20's.        Montreal Cognitive Assessment  09/28/2015  Visuospatial/ Executive (0/5) 5  Naming (0/3) 3  Attention: Read list of digits (0/2) 2  Attention: Read list of letters (0/1) 1  Attention: Serial 7 subtraction starting at 100 (0/3) 3  Language: Repeat phrase (0/2) 2  Language : Fluency (0/1) 1  Abstraction (0/2) 2  Delayed Recall (0/5) 4  Orientation (0/6) 6  Total 29  Adjusted Score (based on education) 29     Office visit 07/20/14:    He reported on 12/16/12 that he was at the beach [place] about 3 hours. On his way back to the resort, he felt a little strange but was able to start a gym workout. Halfway through the workout he could not remember what he had done during the first half of the workout.  He went back to his room but needed to use GPS to find his way back. Because of those symptoms he decided to go to an urgent care center. While his wife was driving him to the urgent care, he felt lightheaded like he was about to pass out. Firemen checked vitals.   His pulse was little elevated and blood pressure was a little reduced. He went by ambulance  to the [facility] emergency room. While in the ambulance, he had difficulty remembering his address and phone number and other information.   He received IV fluids in the ambulance and first urinated a few hours later.   Labs were c/w dehydration.  A head CT and chest CT were reportedly negative.  In the emergency room, he was told that he might have had transient global amnesia.    Three weeks later, he had a short episode of word finding difficulty without other symptoms.  He has had a few episodes of lightheadedness when leaning over with head down.   He had several vasovagal episodes with near syncope associated with yawning followed by lightheadedness.  He has had a couple episodes of vasovagal  near-syncope.     ROS:  Out of a complete 14 system review of symptoms, the patient complains only of the following symptoms, and all other reviewed systems are negative.  Lightheadedness   ALLERGIES: Allergies  Allergen Reactions  . Penicillins Diarrhea    HOME MEDICATIONS:  Current outpatient prescriptions:  .  Ascorbic Acid (VITAMIN C) 1000 MG tablet, Take 1,000 mg by mouth., Disp: , Rfl:  .  aspirin 81 MG tablet, Take 81 mg by mouth daily., Disp: , Rfl:  .  Desoximetasone 0.05 % GEL, Apply topically Two (2) times a day., Disp: , Rfl:  .  ketoconazole (NIZORAL) 2 % shampoo, as needed., Disp: , Rfl:  .  lactobacillus acidophilus (BACID) TABS tablet, Take 1 tablet by mouth daily., Disp: , Rfl:  .  Omega-3 1000 MG CAPS, Take 1,000 mg by mouth 2 (two) times daily. , Disp: , Rfl:  .  RA KRILL OIL 500 MG CAPS, Take by mouth., Disp: , Rfl:   PAST MEDICAL HISTORY: Past Medical History  Diagnosis Date  . Memory loss   . Vision abnormalities     PAST SURGICAL HISTORY: Past Surgical History  Procedure Laterality Date  . Wisdom tooth extraction    . Tee without cardioversion N/A 10/06/2014    Procedure: TRANSESOPHAGEAL ECHOCARDIOGRAM (TEE);  Surgeon: Josue Hector, MD;  Location: [facility];  Service: Cardiovascular;  Laterality: N/A;    FAMILY HISTORY: Family History  Problem Relation Age of Onset  . Stroke Mother   . Lung cancer Father     SOCIAL HISTORY:  Social History   Social History  . Marital Status: Married    Spouse Name: N/A  . Number of Children: N/A  . Years of Education: N/A   Occupational History  . Not on file.   Social History Main Topics  . Smoking status: Never Smoker   . Smokeless tobacco: Never Used  . Alcohol Use: 0.0 oz/week    0 Standard drinks or equivalent per week     Comment: 1/2 glass wine daily  . Drug Use: No  . Sexual Activity: Not on file   Other Topics Concern  . Not on file   Social History Narrative      PHYSICAL EXAM  Filed Vitals:   09/28/15 1141  BP: 128/74  Pulse: 66  Resp: 12  Height: 6' (1.829 m)  Weight: 179 lb 8 oz (81.421 kg)    Body mass index is 24.34 kg/(m^2).   General: The patient is well-developed and well-nourished and in no acute distress  Neck: The neck is supple.  The neck is nontender.  Skin: Extremities are without significant edema.   Neurologic Exam  Mental status: The patient is alert and oriented x 3  at the time of the examination. The patient has apparent borderline normal recent (4/5) and remote memory, with an apparently normal attention span and concentration ability.   Speech is normal.  Cranial nerves: Extraocular movements are full.   There is good facial sensation to soft touch bilaterally.Facial strength is normal.  Trapezius and sternocleidomastoid strength is normal. No dysarthria is noted.  The tongue is midline, and the patient has symmetric elevation of the soft palate.   Motor:  Muscle bulk is normal.   Tone is normal. Strength is  5 / 5 in all 4 extremities.   Sensory: Sensory testing is intact to pinprick, soft touch and vibration sensation in all 4 extremities.  Coordination: Cerebellar testing reveals good finger-nose-finger and heel-to-shin bilaterally.  Gait and station: Station is normal.   Gait is normal. Tandem gait is normal. Romberg is negative.   Reflexes: Deep tendon reflexes are symmetric and normal bilaterally.     DIAGNOSTIC DATA (LABS, IMAGING, TESTING) - I reviewed patient records, labs, notes, testing and imaging myself where available.     ASSESSMENT AND PLAN  PFO with atrial septal aneurysm  Syncope, unspecified syncope type  Abnormal brain MRI - Plan: APOE Alzheimer's Risk, MTHFR DNA Analysis  Mild cognitive impairment - Plan: APOE Alzheimer's Risk, Vitamin B12, TSH, MTHFR DNA Analysis  Memory loss - Plan: VITAMIN D 25 Hydroxy (Vit-D Deficiency, Fractures), Vitamin B12, TSH, MTHFR DNA  Analysis  Vitamin D deficiency - Plan: VITAMIN D 25 Hydroxy (Vit-D Deficiency, Fractures)   1.    He is a 45 year old man who has subtle memory loss who performed well on the Montral cognitive assessment (29/30). We discussed that memory difficulties in the 40s can be due to many different factors. Poor sleep (i.e. OSA), depression, disease, vitamin deficiency and other medical illnesses are the most likely causes. Alzheimer's is very rare the 38s as are other degenerative dementias without a family history. However, he does have some parietal atrophy that can be seen with Alzheimer's disease. Additionally, it appears that the atrophy is slightly more pronounced on the current study compared to the study from last year. 2.    We will check vitamin D, B12, MTHFR and ApoE genotype. 3.   Noncontrasted MRI of the brain will be repeated in one year to determine if there has been additional atrophy. We will repeat cognitive testing at that time. 4.   He is to call us sooner if he has new or worsening neurologic symptoms.  45 minute face-to-face interaction with greater than 50% of the visit counseling and coordinating care about his memory loss and abnormal MRI.   Richard A. Felecia Shelling, MD, PhD [id number], [id number] AM Certified in Neurology, Clinical Neurophysiology, Sleep Medicine, Pain Medicine and Neuroimaging  [facility] [address], [place], [place] [address] [phone]

## 2015-09-28 NOTE — Progress Notes (Signed)
TODAY'S OV NOTE HAS BEEN FAXED TO DR. HAWKS AT [phone], WITH FAX CONFIRMATION RECEIVED/fim

## 2015-10-11 LAB — MTHFR DNA ANALYSIS

## 2015-10-11 LAB — APOE ALZHEIMER'S RISK

## 2015-10-11 LAB — VITAMIN D 25 HYDROXY (VIT D DEFICIENCY, FRACTURES): Vit D, 25-Hydroxy: 22 ng/mL — ABNORMAL LOW (ref 30.0–100.0)

## 2015-10-11 LAB — TSH: TSH: 1.99 u[IU]/mL (ref 0.450–4.500)

## 2015-10-11 LAB — VITAMIN B12: Vitamin B-12: 425 pg/mL (ref 211–946)

## 2015-10-16 ENCOUNTER — Telehealth: Payer: Self-pay | Admitting: Neurology

## 2015-10-16 NOTE — Telephone Encounter (Signed)
I spoke to Dylan Rosario to go over the lab results. Vitamin D is low and he should take 5000 units daily. TSH and B12 are fine. He does carry 1 copy of ApoE4 implying a twofold higher risk of Alzheimer's disease with age.   Interestingly, a study more recently showed that he able before appears to be less of a risk factor for him and then for women.  He will return to see me in about 6 months.

## 2015-10-18 ENCOUNTER — Encounter: Payer: Self-pay | Admitting: Cardiovascular Disease

## 2015-11-02 ENCOUNTER — Encounter: Payer: Self-pay | Admitting: Cardiovascular Disease

## 2016-04-16 ENCOUNTER — Encounter: Payer: Self-pay | Admitting: Cardiovascular Disease

## 2016-04-16 ENCOUNTER — Telehealth: Payer: Self-pay | Admitting: Cardiovascular Disease

## 2016-04-16 NOTE — Telephone Encounter (Signed)
Pt calling regarding 1) wants to find out if he would have to stop taking ASA before a colonoscopy 2) Wants to know who Dr. Burt Knack would recommend for Gastroenterologist  3) in regards to ASA therapy what follow up does he need to check on any issues that may develop due to taking it every day.  Also was going to send a my chart message but Burt Knack wasn't on his provider list for him to select to send the message-any idea how to fix this???  Pls call [phone]

## 2016-04-16 NOTE — Telephone Encounter (Signed)
I spoke with the pt and answered his questions.  1. Normally we do not stop ASA before a colonoscopy (the pt was asking this proactively) 2. [facility] 3. No testing required due to taking ASA daily  I gave the pt the number for the My Chart help desk to see about getting Dr Antionette Char name listed again as a provider.

## 2016-05-01 ENCOUNTER — Encounter: Payer: Self-pay | Admitting: Internal Medicine

## 2016-05-02 ENCOUNTER — Encounter: Payer: Self-pay | Admitting: *Deleted

## 2016-05-03 ENCOUNTER — Ambulatory Visit (INDEPENDENT_AMBULATORY_CARE_PROVIDER_SITE_OTHER): Payer: 59 | Admitting: Internal Medicine

## 2016-05-03 ENCOUNTER — Encounter: Payer: Self-pay | Admitting: Internal Medicine

## 2016-05-03 VITALS — BP 110/78 | HR 88 | Ht 72.0 in | Wt 176.4 lb

## 2016-05-03 DIAGNOSIS — R194 Change in bowel habit: Secondary | ICD-10-CM | POA: Diagnosis not present

## 2016-05-03 DIAGNOSIS — R1030 Lower abdominal pain, unspecified: Secondary | ICD-10-CM

## 2016-05-03 NOTE — Patient Instructions (Signed)
It has been recommended to you by your physician that you have a(n) colonoscopy completed. Per your request, we did not schedule the procedure(s) today. Please contact our office at [phone] should you decide to have the procedure completed.  If you are age 46 or older, your body mass index should be between 23-30. Your Body mass index is 23.92 kg/m. If this is out of the aforementioned range listed, please consider follow up with your Primary Care Provider.  If you are age 26 or younger, your body mass index should be between 19-25. Your Body mass index is 23.92 kg/m. If this is out of the aformentioned range listed, please consider follow up with your Primary Care Provider.

## 2016-05-03 NOTE — Progress Notes (Signed)
Patient ID: KAYAAN THROOP, male   DOB: [birth year], 45 y.o.   MRN: [id number] HPI: Prentiss Jurkovic is a 45 year old male with a past medical history of small PFO who is seen in consultation at the request of Dr. Lenna Gilford to evaluate change in bowel habit and lower abdominal pain. He is here alone today. He reports that over the last 12-18 months he has developed intermittent lower abdominal pain and change in bowel habit. He's noticed pain which he describes as dull located in the lower abdomen both right and left though often not at the same time over the last year to 2. He feels that this may have been brought on by dietary change particularly adding legumes to his diet in the form of black beans. He tries to eat a very healthy diet. He eats a lot of fruits, vegetables and smoothies. He's noticed his bowel habits have changed slightly but he still has a bowel movement about once per day. His bowel movements been somewhat smaller and at times "drier". Several times throughout the past 2 years he has noticed red "matter" in his stool which has been difficult for him to tell if this is blood. No definitive blood with wiping. He denies melena and hematochezia. His appetite has been normal but he has lost about 15,003 years which he thinks is related to dietary changes and lower caloric intake. His activity level has been good he continues to work out frequently. He denies upper GI complaint. No hepatobiliary complaint. He does occasionally have anal or rectal spasm which is a fleeting pain occurring not related to bowel movement. This is rare. No family history of colorectal cancer His mother-in-law was diagnosed with stage IV colon cancer earlier this year at age 78. This was an interval cancer between colonoscopies.  He had multiple questions today about colonoscopy, the risks, benefits of the same. He inquired about polyp detection rate, complication rate, sedation, etc. He reports he has done a great  deal research on colonoscopy   Past Medical History:  Diagnosis Date  . 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 Anxiety     Past Surgical History:  Procedure Laterality Date  . TEE WITHOUT CARDIOVERSION N/A 10/06/2014   Procedure: TRANSESOPHAGEAL ECHOCARDIOGRAM (TEE);  Surgeon: Josue Hector, MD;  Location: [facility] ENDOSCOPY;  Service: Cardiovascular;  Laterality: N/A;  . WISDOM TOOTH EXTRACTION      Outpatient Medications Prior to Visit  Medication Sig Dispense Refill  . Ascorbic Acid (VITAMIN C) 1000 MG tablet Take 1,000 mg by mouth.    Marland Kitchen aspirin 81 MG tablet Take 81 mg by mouth daily.    . Desoximetasone 0.05 % GEL Apply topically Two (2) times a day.    . ketoconazole (NIZORAL) 2 % shampoo as needed.    . lactobacillus acidophilus (BACID) TABS tablet Take 1 tablet by mouth daily.    . Omega-3 1000 MG CAPS Take 1,000 mg by mouth 2 (two) times daily.     Marland Kitchen RA KRILL OIL 500 MG CAPS Take by mouth.     No facility-administered medications prior to visit.     Allergies  Allergen Reactions  . Penicillins Diarrhea    Family History  Problem Relation Age of Onset  . Stroke Mother   . Lung cancer Father   . Colon cancer Neg Hx   . Stomach cancer Neg Hx   . Esophageal cancer Neg Hx   . Rectal cancer Neg Hx   .  Liver cancer Neg Hx     Social History  Substance Use Topics  . Smoking status: Never Smoker  . Smokeless tobacco: Never Used  . Alcohol use 0.0 oz/week     Comment: 1/2 glass wine daily    ROS: As per history of present illness, otherwise negative  BP 110/78   Pulse 88   Ht 6' (1.829 m)   Wt 176 lb 6 oz (80 kg)   BMI 23.92 kg/m  Constitutional: Well-developed and well-nourished. No distress. HEENT: Normocephalic and atraumatic. Oropharynx is clear and moist. No oropharyngeal exudate. Conjunctivae are normal.  No scleral icterus. Neck: Neck supple. Trachea midline. Cardiovascular: Normal rate, regular rhythm and intact distal pulses. No  M/R/G Pulmonary/chest: Effort normal and breath sounds normal. No wheezing, rales or rhonchi. Abdominal: Soft, nontender, nondistended. Bowel sounds active throughout. There are no masses palpable. No hepatosplenomegaly. Extremities: no clubbing, cyanosis, or edema Lymphadenopathy: No cervical adenopathy noted. Neurological: Alert and oriented to person place and time. Skin: Skin is warm and dry. No rashes noted. Psychiatric: Normal mood and affect. Behavior is normal.  ASSESSMENT/PLAN: 45 year old male with a past medical history of small PFO who is seen in consultation at the request of Dr. Lenna Gilford to evaluate change in bowel habit and lower abdominal pain.   1. Intermittent lower abdominal pain/mild change in bowel habit/question of blood in stool -- I recommended proceeding with colonoscopy to further evaluate his symptoms and rule out colitis. We discussed the test in great detail including the risks, benefits and alternatives. He would like to discuss this more with his wife that he feels he will likely call back to schedule. He understands he will have a previsit once he schedules the test. He can continue his baby aspirin throughout the procedure process      [id number] [place], [place] [address]

## 2016-05-30 ENCOUNTER — Encounter: Payer: Self-pay | Admitting: Internal Medicine

## 2016-05-31 NOTE — Telephone Encounter (Signed)
Patient's wife, Missy, can be scheduled for direct colonoscopy elevated risk colorectal cancer screening given family history of colon cancer in first degree relative (her mother)

## 2016-06-04 ENCOUNTER — Encounter: Payer: Self-pay | Admitting: Internal Medicine

## 2016-07-02 ENCOUNTER — Ambulatory Visit: Payer: 59 | Admitting: Internal Medicine

## 2016-07-30 ENCOUNTER — Encounter: Payer: Self-pay | Admitting: Internal Medicine

## 2016-07-31 ENCOUNTER — Encounter: Payer: Self-pay | Admitting: Internal Medicine

## 2016-07-31 ENCOUNTER — Ambulatory Visit (AMBULATORY_SURGERY_CENTER): Payer: Self-pay

## 2016-07-31 VITALS — Ht 73.0 in | Wt 175.6 lb

## 2016-07-31 DIAGNOSIS — R194 Change in bowel habit: Secondary | ICD-10-CM

## 2016-07-31 MED ORDER — NA SULFATE-K SULFATE-MG SULF 17.5-3.13-1.6 GM/177ML PO SOLN: 1.0000 | Freq: Once | ORAL | 0 refills | Status: AC

## 2016-07-31 NOTE — Progress Notes (Signed)
Denies allergies to eggs or soy products. Denies complication of anesthesia or sedation. Denies use of weight loss medication. Denies use of O2.   Emmi instructions given for colonoscopy.  

## 2016-08-08 ENCOUNTER — Encounter: Payer: Self-pay | Admitting: Internal Medicine

## 2016-08-08 ENCOUNTER — Ambulatory Visit (AMBULATORY_SURGERY_CENTER): Payer: 59 | Admitting: Internal Medicine

## 2016-08-08 VITALS — BP 90/63 | HR 57 | Temp 96.9°F | Resp 20 | Ht 72.0 in | Wt 175.0 lb

## 2016-08-08 DIAGNOSIS — R194 Change in bowel habit: Secondary | ICD-10-CM | POA: Diagnosis present

## 2016-08-08 DIAGNOSIS — D129 Benign neoplasm of anus and anal canal: Secondary | ICD-10-CM

## 2016-08-08 DIAGNOSIS — D128 Benign neoplasm of rectum: Secondary | ICD-10-CM

## 2016-08-08 DIAGNOSIS — K621 Rectal polyp: Secondary | ICD-10-CM

## 2016-08-08 MED ORDER — SODIUM CHLORIDE 0.9 % IV SOLN: 500.0000 mL | INTRAVENOUS | Status: AC

## 2016-08-08 NOTE — Op Note (Signed)
[place] Patient Name: Dylan Rosario Procedure Date: 08/08/2016 10:50 AM MRN: [id number] Endoscopist: Jerene Bears , MD Age: 46 Referring MD:  Date of Birth: [birth year] Gender: Male Account #: [id number] Procedure:                Colonoscopy Indications:              Change in bowel habits Medicines:                Monitored Anesthesia Care Procedure:                Pre-Anesthesia Assessment:                           - Prior to the procedure, a History and Physical                            was performed, and patient medications and                            allergies were reviewed. The patient's tolerance of                            previous anesthesia was also reviewed. The risks                            and benefits of the procedure and the sedation                            options and risks were discussed with the patient.                            All questions were answered, and informed consent                            was obtained. Prior Anticoagulants: The patient has                            taken no previous anticoagulant or antiplatelet                            agents. ASA Grade Assessment: II - A patient with                            mild systemic disease. After reviewing the risks                            and benefits, the patient was deemed in                            satisfactory condition to undergo the procedure.                           After obtaining informed consent, the colonoscope  was passed under direct vision. Throughout the                            procedure, the patient's blood pressure, pulse, and                            oxygen saturations were monitored continuously. The                            Colonoscope was introduced through the anus and                            advanced to the the terminal ileum. The colonoscopy                            was performed without difficulty.  The patient                            tolerated the procedure well. The quality of the                            bowel preparation was excellent. The terminal                            ileum, ileocecal valve, appendiceal orifice, and                            rectum were photographed. Scope In: 10:56:40 AM Scope Out: 11:13:12 AM Scope Withdrawal Time: 0 hours 10 minutes 46 seconds  Total Procedure Duration: 0 hours 16 minutes 32 seconds  Findings:                 The perianal and digital rectal examinations were                            normal.                           The terminal ileum appeared normal.                           A 2 mm polyp was found in the rectum. The polyp was                            sessile. The polyp was removed with a cold biopsy                            forceps. Resection and retrieval were complete.                           Non-bleeding internal hemorrhoids were found during                            retroflexion. The hemorrhoids were small.  The exam was otherwise without abnormality. Complications:            No immediate complications. Estimated Blood Loss:     Estimated blood loss was minimal. Impression:               - The examined portion of the ileum was normal.                           - One 2 mm polyp in the rectum, removed with a cold                            biopsy forceps. Resected and retrieved.                           - Non-bleeding internal hemorrhoids.                           - The examination was otherwise normal. Recommendation:           - Patient has a contact number available for                            emergencies. The signs and symptoms of potential                            delayed complications were discussed with the                            patient. Return to normal activities tomorrow.                            Written discharge instructions were provided to the                             patient.                           - Resume previous diet.                           - Continue present medications.                           - Await pathology results.                           - Repeat colonoscopy is recommended. The                            colonoscopy date will be determined after pathology                            results from today's exam become available for                            review. Jerene Bears, MD 08/08/2016 11:16:47 AM This report has been  signed electronically.

## 2016-08-08 NOTE — Patient Instructions (Signed)
YOU HAD AN ENDOSCOPIC PROCEDURE TODAY AT [facility]:   Refer to the procedure report that was given to you for any specific questions about what was found during the examination.  If the procedure report does not answer your questions, please call your gastroenterologist to clarify.  If you requested that your care partner not be given the details of your procedure findings, then the procedure report has been included in a sealed envelope for you to review at your convenience later.  YOU SHOULD EXPECT: Some feelings of bloating in the abdomen. Passage of more gas than usual.  Walking can help get rid of the air that was put into your GI tract during the procedure and reduce the bloating. If you had a lower endoscopy (such as a colonoscopy or flexible sigmoidoscopy) you may notice spotting of blood in your stool or on the toilet paper. If you underwent a bowel prep for your procedure, you may not have a normal bowel movement for a few days.  Please Note:  You might notice some irritation and congestion in your nose or some drainage.  This is from the oxygen used during your procedure.  There is no need for concern and it should clear up in a day or so.  SYMPTOMS TO REPORT IMMEDIATELY:   Following lower endoscopy (colonoscopy or flexible sigmoidoscopy):  Excessive amounts of blood in the stool  Significant tenderness or worsening of abdominal pains  Swelling of the abdomen that is new, acute  Fever of 100F or higher    For urgent or emergent issues, a gastroenterologist can be reached at any hour by calling [phone].   DIET:  We do recommend a small meal at first, but then you may proceed to your regular diet.  Drink plenty of fluids but you should avoid alcoholic beverages for 24 hours.  ACTIVITY:  You should plan to take it easy for the rest of today and you should NOT DRIVE or use heavy machinery until tomorrow (because of the sedation medicines used during the test).     FOLLOW UP: Our staff will call the number listed on your records the next business day following your procedure to check on you and address any questions or concerns that you may have regarding the information given to you following your procedure. If we do not reach you, we will leave a message.  However, if you are feeling well and you are not experiencing any problems, there is no need to return our call.  We will assume that you have returned to your regular daily activities without incident.  If any biopsies were taken you will be contacted by phone or by letter within the next 1-3 weeks.  Please call us at [phone] if you have not heard about the biopsies in 3 weeks.    SIGNATURES/CONFIDENTIALITY: You and/or your care partner have signed paperwork which will be entered into your electronic medical record.  These signatures attest to the fact that that the information above on your After Visit Summary has been reviewed and is understood.  Full responsibility of the confidentiality of this discharge information lies with you and/or your care-partner.   Information on polyps and hemorrhoids given to you today  Await pathology results

## 2016-08-08 NOTE — Progress Notes (Signed)
Report to PACU, RN, vss, BBS= Clear.  

## 2016-08-08 NOTE — Progress Notes (Signed)
Called to room to assist during endoscopic procedure.  Patient ID and intended procedure confirmed with present staff. Received instructions for my participation in the procedure from the performing physician.  

## 2016-08-09 ENCOUNTER — Telehealth: Payer: Self-pay | Admitting: *Deleted

## 2016-08-09 NOTE — Telephone Encounter (Signed)
  Follow up Call-  Call back number 08/08/2016  Post procedure Call Back phone  # [phone]  Permission to leave phone message Yes  Some recent data might be hidden     Patient questions:  Do you have a fever, pain , or abdominal swelling? No. Pain Score  0 *  Have you tolerated food without any problems? Yes.    Have you been able to return to your normal activities? Yes.    Do you have any questions about your discharge instructions: Diet   No. Medications  No. Follow up visit  No.  Do you have questions or concerns about your Care? No.  Actions: * If pain score is 4 or above: No action needed, pain <4.

## 2016-08-13 ENCOUNTER — Encounter: Payer: 59 | Admitting: Internal Medicine

## 2016-08-14 ENCOUNTER — Encounter: Payer: Self-pay | Admitting: Internal Medicine

## 2017-04-10 ENCOUNTER — Encounter: Payer: Self-pay | Admitting: Cardiovascular Disease

## 2017-04-11 ENCOUNTER — Ambulatory Visit: Payer: 59 | Admitting: Sports Medicine

## 2017-04-11 ENCOUNTER — Encounter: Payer: Self-pay | Admitting: Sports Medicine

## 2017-04-11 DIAGNOSIS — M25531 Pain in right wrist: Secondary | ICD-10-CM | POA: Insufficient documentation

## 2017-04-11 NOTE — Progress Notes (Signed)
   [facility] Phone: [phone]  Subjective:  Dylan Rosario is a 46 year old male presenting to clinic with right wrist pain for the last two weeks. The pain started after he was doing plyometric push-ups. He didn't have any pain while doing the push-ups, but noted "soreness" the next day. He also had stiffness of his wrist and decreased ROM with extension of the wrist. The pain and stiffness seemed to get worse over the course of a few days, but has been getting better since then. He has been unable to do push-ups, bench, or dips because of the pain. He has no pain with gripping weights. He also noticed that his hand and wrist seemed to "fatigue" faster with typing and using a mouse at work. He has not tried any medications or icing. He did not notice any redness or swelling of the wrist.  ROS: See HPI for pertinent positives and negatives  Objective: BP 108/78   Ht 6' (1.829 m)   Wt 175 lb (79.4 kg)   BMI 23.73 kg/m  Gen: NAD, alert, cooperative with exam Right Wrist: No erythema, edema, or gross deformity. Normal ROM with flexion, but mildly decreased ROM with extension of the right wrist in comparison to the left wrist. Point tenderness to palpation over the scaphoid tubercle. No tenderness over the hook of hamate. No tenderness over the snuffbox.  Neuro: Right hand is neurovascularly intact.  Assessment/Plan: Right Wrist Pain: Likely a soft tissue injury over the scaphoid tubercle. No severe pain, erythema, or edema to suggest scaphoid fracture. - Advised symptomatic care with NSAIDs, ice, and activity modification - Can also try using pad to help cushion the injured area - Follow-up if no improvement   Hyman Bible, MD PGY-3  Patient seen and evaluated with the resident. I agree with the above plan of care. History and exam favor simple contusion which should improve over time. I recommend 3-4 days of Aleve as well as a well cushioned glove when weight lifting. No  need for imaging at this time. Follow-up for ongoing or recalcitrant issues.

## 2017-04-11 NOTE — Assessment & Plan Note (Signed)
Likely a soft tissue injury over the scaphoid tubercle. No severe pain, erythema, or edema to suggest scaphoid fracture. - Advised symptomatic care with NSAIDs, ice, and activity modification - Can also try using pad to help cushion the injured area - Follow-up if no improvement

## 2017-04-14 ENCOUNTER — Ambulatory Visit: Payer: 59 | Admitting: Cardiovascular Disease

## 2017-04-14 ENCOUNTER — Encounter: Payer: Self-pay | Admitting: Cardiovascular Disease

## 2017-04-14 VITALS — BP 122/68 | HR 66 | Ht 72.0 in | Wt 178.0 lb

## 2017-04-14 DIAGNOSIS — Q211 Atrial septal defect: Secondary | ICD-10-CM

## 2017-04-14 DIAGNOSIS — Q2112 Patent foramen ovale: Secondary | ICD-10-CM

## 2017-04-14 NOTE — Patient Instructions (Signed)
Medication Instructions:  Your provider recommends that you continue on your current medications as directed. Please refer to the Current Medication list given to you today.    Labwork: None  Testing/Procedures: Your physician has recommended that you have a sleep study. This test records several body functions during sleep, including: brain activity, eye movement, oxygen and carbon dioxide blood levels, heart rate and rhythm, breathing rate and rhythm, the flow of air through your mouth and nose, snoring, body muscle movements, and chest and belly movement.  Dr. Burt Knack recommends you have a CORONARY CALCIUM SCORE.  Your physician has requested that you have a carotid duplex. This test is an ultrasound of the carotid arteries in your neck. It looks at blood flow through these arteries that supply the brain with blood. Allow one hour for this exam. There are no restrictions or special instructions.  Your provider has requested that you have a lower extremity arterial duplex. During this test, ultrasound is used to evaluate arterial blood flow in the legs. Allow one hour for this exam. There are no restrictions or special instructions.    Your physician has requested that you have an abdominal aorta duplex. During this test, an ultrasound is used to evaluate the aorta. Allow 30 minutes for this exam. Do not eat after midnight the day before and avoid carbonated beverages.  Follow-Up: Your provider wants you to follow-up in: 1 year with Dr. Burt Knack. You will receive a reminder letter in the mail two months in advance. If you don't receive a letter, please call our office to schedule the follow-up appointment.    Any Other Special Instructions Will Be Listed Below (If Applicable).     If you need a refill on your cardiac medications before your next appointment, please call your pharmacy.

## 2017-04-14 NOTE — Progress Notes (Signed)
Cardiology Office Note Date:  04/14/2017   ID:  Dylan Rosario, DOB [birth year], MRN [id number]  PCP:  Maylon Peppers, MD  Cardiologist:  Janne Lab, MD    Chief Complaint  Patient presents with  . PFO with atrial septal aneurysm    follow up     History of Present Illness: Dylan Rosario is a 46 y.o. 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 He initially presented in 2014 with an episode of sudden memory impariment and near-syncope. MRI of the brain showed multiple foci in the subcortical white matter. A TEE showed atrial septal aneurysm with no color-flow across the septum but there was a positive agitated saline study. Medical therapy was recommended because the PFO appeared to be very small. He presents today for follow-up evaluation.   The patient is here alone today.  He brings in a list of questions he wants to discuss.  He feels fine and continues to participate in regular exercise.  He denies heart palpitations, chest pain, shortness of breath, lightheadedness, or recurrent neurologic symptoms.  Past Medical History:  Diagnosis Date  . 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 Anxiety   . Memory loss   . PFO (patent foramen ovale)   . Vision abnormalities     Past Surgical History:  Procedure Laterality Date  . TRANSESOPHAGEAL ECHOCARDIOGRAM (TEE) N/A 10/06/2014   Performed by Josue Hector, MD at [facility]  . WISDOM TOOTH EXTRACTION      Current Outpatient Medications  Medication Sig Dispense Refill  . Ascorbic Acid (VITAMIN C) 1000 MG tablet Take 1,000 mg daily by mouth.     Marland Kitchen aspirin 81 MG tablet Take 81 mg by mouth daily.    . cholecalciferol (VITAMIN D) 1000 units tablet Take 4,000 Units by mouth daily.     . Desoximetasone 0.05 % GEL Apply topically Two (2) times a day.    . ketoconazole (NIZORAL) 2 % cream Apply 1 application topically daily.    Marland Kitchen ketoconazole (NIZORAL) 2 % shampoo Apply 1 application as  directed topically.     . lactobacillus acidophilus (BACID) TABS tablet Take 1 tablet by mouth daily.    . magnesium citrate SOLN Take by mouth daily. 24 sprays topically once a day.    . Menaquinone-7 (VITAMIN K2 PO) Take by mouth as directed. One every third day    . Omega-3 1000 MG CAPS Take 1,000 mg by mouth 2 (two) times daily.     Marland Kitchen RA KRILL OIL 500 MG CAPS Take 1 capsule daily by mouth.     . TURMERIC CURCUMIN PO Take 1 tablet by mouth daily.    . Zinc 30 MG CAPS Take 1 capsule by mouth daily.     Current Facility-Administered Medications  Medication Dose Route Frequency Provider Last Rate Last Dose  . 0.9 %  sodium chloride infusion  500 mL Intravenous Continuous Pyrtle, Lajuan Lines, MD        Allergies:   Penicillins   Social History:  The patient  reports that  has never smoked. he has never used smokeless tobacco. He reports that he drinks alcohol. He reports that he does not use drugs.   Family History:  The patient's  family history includes Lung cancer in his father; Stroke in his mother.    ROS:  Please see the history of present illness.  All other systems are reviewed and negative.  PHYSICAL EXAM: VS:  BP 122/68   Pulse 66   Ht 6' (1.829 m)   Wt 178 lb (80.7 kg)   BMI 24.14 kg/m  , BMI Body mass index is 24.14 kg/m. GEN: Well nourished, well developed, in no acute distress  HEENT: normal  Neck: no JVD, no masses. No carotid bruits Cardiac: RRR without murmur or gallop     Respiratory:  clear to auscultation bilaterally, normal work of breathing GI: soft, nontender, nondistended, + BS MS: no deformity or atrophy  Ext: no pretibial edema, pedal pulses 2+= bilaterally Skin: warm and dry, no rash Neuro:  Strength and sensation are intact Psych: euthymic mood, full affect  EKG:  EKG is ordered today. The ekg ordered today shows normal sinus rhythm 66 bpm, rightward axis.  Compared to tracing from 09/28/2014 there are no significant changes.  Recent Labs: No  results found for requested labs within last 8760 hours.   Lipid Panel  No results found for: CHOL, TRIG, HDL, CHOLHDL, VLDL, LDLCALC, LDLDIRECT    Wt Readings from Last 3 Encounters:  04/14/17 178 lb (80.7 kg)  04/11/17 175 lb (79.4 kg)  08/08/16 175 lb (79.4 kg)     Cardiac Studies Reviewed: Echo 08-11-2014: Study Conclusions  - Left ventricle: The cavity size was normal. Wall thickness was normal. Systolic function was normal. The estimated ejection fraction was in the range of 60% to 65%. Wall motion was normal; there were no regional wall motion abnormalities. Doppler parameters are consistent with abnormal left ventricular relaxation (grade 1 diastolic dysfunction). The E/e&' ratio is <8, suggesting normal LV filling pressure. - Left atrium: The atrium was normal in size. - Atrial septum: Aneurysmal IAS. There is a moderate-sized PFO with right to left shunting by saline microbubble contrast.  Impressions:  - LVEF 60-65%, normal wall thickness, normal wall motion, normal chamber sizes, diastolic dysfunction with normal LV filling pressure. Atrial septal aneurysm with moderate-sized PFO and right to left shunting by saline microbubble contrast.  Echo TEE 10-06-2014: Study Conclusions  - Impressions: Normal EF 60%   Normal valves   No aortic debris   No effusion   No LAA thrombus     Redundant and aneurysmal atrial septum No fenestrations. PFO not   seen by 2D and color. However bubble study positive x2 with   coughing and   valsalva. No obvious sinus venosis defect or anomalous veins   although the right nferior PV not well seen.    ASSESSMENT AND PLAN: 1.  PFO: I reviewed the patient's TEE images again today.  He has an interatrial septal aneurysm with small PFO.  He had a somewhat equivocal brain MRI several years ago when he had neurologic symptoms.  He is now followed by neurology and will continue with medical therapy using aspirin 81 mg  daily.  We discussed all available data regarding transcatheter PFO closure and the natural history of PFO and stroke.  He has had no issues over the last 2 years and will continue with his current treatment strategy.  2.  Hyperlipidemia: Most recent lipids are reviewed.  We discussed consideration of pharmacotherapy in the context of his known medical history.  He has not had documentation of atherosclerotic disease.  He is not in favor of a statin drug if at all possible.  He will likely continue to follow with his primary physician and may undergo advanced lipid testing again next year.  I did review his lipid findings from his last lab results.  3.  Cardiovascular screening: We discussed multiple issues today.  He wanted information on all possible screening tests.  He will consider a CT coronary calcium scan and also ultrasound screening.  I think the CT coronary calcium score could give him some good prognostic information.  We also discussed the role of CRP, APO B, and other advanced lipid testing.  4.  Poor sleep: We discussed the role of overnight sleep testing.  He will consider pursuing this.  He does admit to feeling like he does not get good rest and he is not well rested in the morning when he first wakes up.  Current medicines are reviewed with the patient today.  The patient does not have concerns regarding medicines.  Labs/ tests ordered today include:   Orders Placed This Encounter  Procedures  . EKG 12-Lead  . EKG 12-Lead    Disposition:   FU one year  Signed, Janne Lab, MD  04/14/2017 1:38 PM    [facility] [address], [place], [place]  [address] Phone: [phone]; Fax: [phone]

## 2017-05-01 ENCOUNTER — Encounter: Payer: Self-pay | Admitting: Cardiovascular Disease

## 2017-05-01 ENCOUNTER — Telehealth: Payer: Self-pay

## 2017-05-01 DIAGNOSIS — Z136 Encounter for screening for cardiovascular disorders: Secondary | ICD-10-CM

## 2017-05-01 NOTE — Telephone Encounter (Signed)
See today's MyChart message. Vascuscreen and calcium score ordered for scheduling.

## 2017-05-08 NOTE — Telephone Encounter (Signed)
Calcium score has been scheduled 12/21. Vascuscreen has been scheduled 12/31.

## 2017-05-16 ENCOUNTER — Ambulatory Visit (INDEPENDENT_AMBULATORY_CARE_PROVIDER_SITE_OTHER)
Admission: RE | Admit: 2017-05-16 | Discharge: 2017-05-16 | Disposition: A | Discharge status: Home / Self Care | Payer: Self-pay | Source: Ambulatory Visit | Attending: Cardiovascular Disease | Admitting: Cardiovascular Disease

## 2017-05-16 DIAGNOSIS — Z136 Encounter for screening for cardiovascular disorders: Secondary | ICD-10-CM

## 2017-05-26 ENCOUNTER — Ambulatory Visit (HOSPITAL_COMMUNITY)
Admission: RE | Admit: 2017-05-26 | Discharge: 2017-05-26 | Disposition: A | Discharge status: Home / Self Care | Payer: 59 | Source: Ambulatory Visit | Attending: Internal Medicine | Admitting: Internal Medicine

## 2017-05-26 DIAGNOSIS — Z136 Encounter for screening for cardiovascular disorders: Secondary | ICD-10-CM

## 2017-12-01 ENCOUNTER — Encounter: Payer: Self-pay | Admitting: Cardiovascular Disease

## 2017-12-01 DIAGNOSIS — R931 Abnormal findings on diagnostic imaging of heart and coronary circulation: Secondary | ICD-10-CM

## 2017-12-04 ENCOUNTER — Other Ambulatory Visit: Payer: 59

## 2017-12-04 DIAGNOSIS — R931 Abnormal findings on diagnostic imaging of heart and coronary circulation: Secondary | ICD-10-CM

## 2017-12-06 LAB — NMR, LIPOPROFILE
Cholesterol, Total: 220 mg/dL — ABNORMAL HIGH (ref 100–199)
HDL Particle Number: 36.2 umol/L (ref >=30.5)
HDL-C: 105 mg/dL (ref >39)
LDL PARTICLE NUMBER: 1116 nmol/L — AB (ref <1000)
LDL Size: 21.2 nm (ref >20.5)
LDL-C: 105 mg/dL — AB (ref 0–99)
LP-IR Score: <25 (ref <=45)
TRIGLYCERIDES: 50 mg/dL (ref 0–149)

## 2017-12-06 LAB — LIPOPROTEIN A (LPA): LIPOPROTEIN (A): 112 nmol/L — AB (ref <75)

## 2017-12-06 LAB — APOLIPOPROTEIN B: Apolipoprotein B: 93 mg/dL — ABNORMAL HIGH (ref <90)

## 2017-12-06 LAB — HIGH SENSITIVITY CRP: CRP HIGH SENSITIVITY: 0.23 mg/L (ref 0.00–3.00)

## 2017-12-30 ENCOUNTER — Telehealth: Payer: Self-pay

## 2017-12-30 DIAGNOSIS — E7800 Pure hypercholesterolemia, unspecified: Secondary | ICD-10-CM

## 2017-12-30 DIAGNOSIS — R931 Abnormal findings on diagnostic imaging of heart and coronary circulation: Secondary | ICD-10-CM

## 2017-12-30 DIAGNOSIS — E7841 Elevated Lipoprotein(a): Secondary | ICD-10-CM

## 2017-12-30 NOTE — Telephone Encounter (Signed)
Referral placed for patient to see Dr. Debara Pickett in the [facility].       Notes recorded by Theodoro Parma, RN on 12/09/2017 at 5:43 PM EDT Informed patient of results and verbal understanding expressed.  Patient wishes to make appointment with Dr. Debara Pickett, but not until his vacation is over. He requests referral is placed 8/5. He was grateful for call and agrees with treatment plan ------  Notes recorded by Sherren Mocha, MD on 12/08/2017 at 8:12 AM EDT Compared lipid panel to previous from 2018. Lp(a) and ApoB remain mildly elevated. Other fractionated cholesterol values appear favorable. LDL particle number remains > 1000 but improved from previous and total cholesterol also lower than past value of 252. If he wants further discussion with a Lipid specialist, favor referral to Dr Debara Pickett in [facility]. thanks

## 2018-02-11 ENCOUNTER — Ambulatory Visit: Payer: 59 | Admitting: Internal Medicine

## 2018-02-11 ENCOUNTER — Encounter: Payer: Self-pay | Admitting: Internal Medicine

## 2018-02-11 VITALS — BP 124/60 | HR 66 | Ht 73.0 in | Wt 180.0 lb

## 2018-02-11 DIAGNOSIS — E7841 Elevated Lipoprotein(a): Secondary | ICD-10-CM | POA: Diagnosis not present

## 2018-02-11 DIAGNOSIS — E7800 Pure hypercholesterolemia, unspecified: Secondary | ICD-10-CM | POA: Diagnosis not present

## 2018-02-11 DIAGNOSIS — R931 Abnormal findings on diagnostic imaging of heart and coronary circulation: Secondary | ICD-10-CM | POA: Diagnosis not present

## 2018-02-11 NOTE — Patient Instructions (Signed)
Medication Instructions:   Continue current medications  Labwork:  Fasting lab work in 3 months (mid-late December) to recheck cholesterol  Testing/Procedures:  None  Follow-Up:  Your physician recommends that you schedule a follow-up appointment as needed with Dr. Debara Pickett.   If you need a refill on your cardiac medications before your next appointment, please call your pharmacy.  Any Other Special Instructions Will Be Listed Below (If Applicable).

## 2018-02-13 ENCOUNTER — Encounter: Payer: Self-pay | Admitting: Internal Medicine

## 2018-02-13 DIAGNOSIS — E7800 Pure hypercholesterolemia, unspecified: Secondary | ICD-10-CM | POA: Insufficient documentation

## 2018-02-13 DIAGNOSIS — R931 Abnormal findings on diagnostic imaging of heart and coronary circulation: Secondary | ICD-10-CM | POA: Insufficient documentation

## 2018-02-13 DIAGNOSIS — E7841 Elevated Lipoprotein(a): Secondary | ICD-10-CM | POA: Insufficient documentation

## 2018-02-13 NOTE — Progress Notes (Signed)
OFFICE CONSULT NOTE  Chief Complaint:  Evaluate dyslipidemia, elevated LPa  Primary Care Physician: Dylan Peppers, MD  HPI:  Dylan Rosario is a 47 y.o. male who is being seen today for the evaluation of dyslipidemia at the request of Dylan Peppers, MD.  This is a very pleasant 47 year old male who is currently referred to me by Dr. Burt Rosario for evaluation of dyslipidemia.  Past medical history significant for anxiety, and a recent episode of memory loss subsequently determined to be a possible TIA.  He was then found to have PFO and underwent successful closure by Dr. Burt Rosario.  In addition, he had advanced lipid testing recently which was compared to a prior study in January 2018.  LDL-P has decreased from 13 24-11 16.  LDL-see is down from 1 48-1 05.  He did have an LP(a) which was elevated at 112.  HDL-P is 36.2 and HDL C has increased from 94-1 05.  Triglycerides have come down from 51-50.  High-sensitivity CRP was low at 0.23.  He carries the ApoE 3/4 phenotype, which does play set up at increased risk for coronary disease.  Most of these changes in his lipid profile were due to significant changes in his diet recently.  Currently he is eating a diet of 40% carbs, 30% protein and 30% fat.  He says he gets quality [employer] and exercises regularly including cardio and weightlifting.  He does report that he gets poor sleep at night, mostly due to staying up later than he would like to.  Family history significant for mother who had high cholesterol and atherosclerosis and may have had death due to a ruptured aneurysm.  He did undergo a vascular screen which showed no evidence of aneurysm however there was mild carotid plaque with normal flow velocities.  Lower extremity arterial Dopplers were normal.  He is currently asymptomatic, denying chest pain or worsening shortness of breath.  PMHx:  Past Medical History:  Diagnosis Date  . 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 Anxiety    . Memory loss   . PFO (patent foramen ovale)   . Vision abnormalities     Past Surgical History:  Procedure Laterality Date  . TEE WITHOUT CARDIOVERSION N/A 10/06/2014   Procedure: TRANSESOPHAGEAL ECHOCARDIOGRAM (TEE);  Surgeon: Dylan Hector, MD;  Location: [facility] ENDOSCOPY;  Service: Cardiovascular;  Laterality: N/A;  . WISDOM TOOTH EXTRACTION      FAMHx:  Family History  Problem Relation Age of Onset  . Stroke Mother   . Lung cancer Father   . Colon cancer Neg Hx   . Stomach cancer Neg Hx   . Esophageal cancer Neg Hx   . Rectal cancer Neg Hx   . Liver cancer Neg Hx     SOCHx:   reports that he has never smoked. He has never used smokeless tobacco. He reports that he drinks alcohol. He reports that he does not use drugs.  ALLERGIES:  Allergies  Allergen Reactions  . Penicillins Diarrhea    ROS: Pertinent items noted in HPI and remainder of comprehensive ROS otherwise negative.  HOME MEDS: Current Outpatient Medications on File Prior to Visit  Medication Sig Dispense Refill  . Ascorbic Acid (VITAMIN C) 1000 MG tablet Take 1,000 mg daily by mouth.     Marland Kitchen aspirin 81 MG tablet Take 81 mg by mouth daily.    . cholecalciferol (VITAMIN D) 1000 units tablet Take 4,000 Units by mouth daily.     Marland Kitchen  Desoximetasone 0.05 % GEL Apply topically Two (2) times a day.    . ketoconazole (NIZORAL) 2 % cream Apply 1 application topically daily.    Marland Kitchen ketoconazole (NIZORAL) 2 % shampoo Apply 1 application as directed topically.     . lactobacillus acidophilus (BACID) TABS tablet Take 1 tablet by mouth daily.    . magnesium citrate SOLN Take by mouth daily. 24 sprays topically once a day.    . Menaquinone-7 (VITAMIN K2 PO) Take by mouth every other day. One every third day     . Omega-3 1000 MG CAPS Take 1,000 mg by mouth 2 (two) times daily.     . TURMERIC CURCUMIN PO Take 1 tablet by mouth daily.    . Zinc 30 MG CAPS Take 1 capsule by mouth daily.     Current Facility-Administered  Medications on File Prior to Visit  Medication Dose Route Frequency Provider Last Rate Last Dose  . 0.9 %  sodium chloride infusion  500 mL Intravenous Continuous Pyrtle, Dylan Lines, MD        LABS/IMAGING: No results found for this or any previous visit (from the past 48 hour(s)). No results found.  LIPID PANEL: No results found for: CHOL, TRIG, HDL, CHOLHDL, VLDL, LDLCALC, LDLDIRECT  WEIGHTS: Wt Readings from Last 3 Encounters:  02/11/18 180 lb (81.6 kg)  04/14/17 178 lb (80.7 kg)  04/11/17 175 lb (79.4 kg)    VITALS: BP 124/60 (BP Location: Left Arm, Patient Position: Sitting, Cuff Size: Normal)   Pulse 66   Ht 6\' 1"  (1.854 m)   Wt 180 lb (81.6 kg)   BMI 23.75 kg/m   EXAM: General appearance: alert and no distress Neck: no carotid bruit, no JVD and thyroid not enlarged, symmetric, no tenderness/mass/nodules Lungs: clear to auscultation bilaterally Heart: regular rate and rhythm Abdomen: soft, non-tender; bowel sounds normal; no masses,  no organomegaly Extremities: extremities normal, atraumatic, no cyanosis or edema Pulses: 2+ and symmetric Skin: Skin color, texture, turgor normal. No rashes or lesions Neurologic: Grossly normal Psych: Pleasant  EKG: Deferred  ASSESSMENT: 1. History of TIA 2. Atrial septal aneurysm with PFO status post device closure 3. Mixed dyslipidemia  PLAN: 1.   [name] has a mixed dyslipidemia which recently has improved significantly with dietary changes.  He is asked a number of questions today which I did my best to answer.  One of which was whether he should try to lower saturated fats to 20% from 30% in the diet.  I do think this would help to increase his numbers.  He would likely need to increase calories.  His LP(a) is elevated at 112, predicting possible increased risk of early onset heart disease and or aortic stenosis.  He may be a possible candidate for upcoming clinical trials that are designed to address this.  For now his  cholesterol control is very good.  There is a question as to whether or not he has any early onset coronary artery disease.  Dr. Burt Rosario appropriately ordered a CT coronary artery calcium score which did show a trivial amount of calcification and a calcium score of 10.  Based on this evidence I would argue for aggressive therapy with a target LDL less than 70 or particle number less than 1000.  He has almost totally optimized his diet and it is unlikely he will reach these numbers on his own.  Unfortunately, he is not interested in statin therapy.  One possible "natural" alternative would be red yeast rice.  This could give him 5 to 10% benefit in lowering cholesterol and may be more palatable for him.  He will continue to work on dietary changes, reducing saturated fat and will repeat a lipid profile in 3 months.  If is not yet at goal I would strongly consider adding red yeast rice.  Thanks again for the consultation.  Pixie Casino, MD, [facility], [place] Director of the Advanced Lipid Disorders &  Cardiovascular Risk Reduction Clinic Diplomate of the American Board of Clinical Lipidology Attending Cardiologist  Direct Dial: [phone]  Fax: [phone]  Website:  [email or web address]  Nadean Corwin Benay Pomeroy 02/13/2018, 4:25 PM

## 2018-03-20 IMAGING — CT CT HEART SCORING
2 series · 16 of 20 positions shown, 18 images · non-contrast
Comparison: None.

CLINICAL DATA: Risk stratification

EXAM:
Coronary Calcium Score
TECHNIQUE: The patient was scanned on a Siemens Force scanner. Axial
non-contrast 3 mm slices were carried out through the heart. The
data set was analyzed on a dedicated work station and scored using
the Agatson method.

[Series 3: casc 3.0 i36f 2 bestdiast 71 % · axial · 0.33mm/px · z∈[+984,+1110]mm · 8 of 55 slices shown, 10 images]
[im 7/55  vessel]
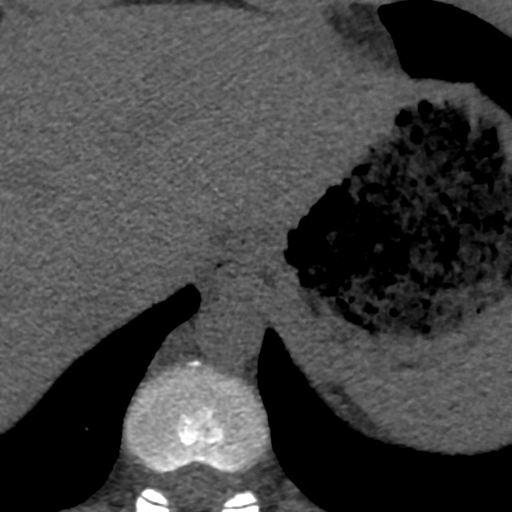
[im 7/55  lung]
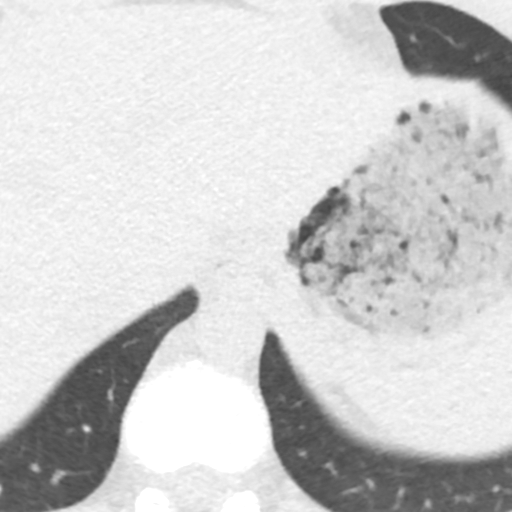
[im 13/55  vessel]
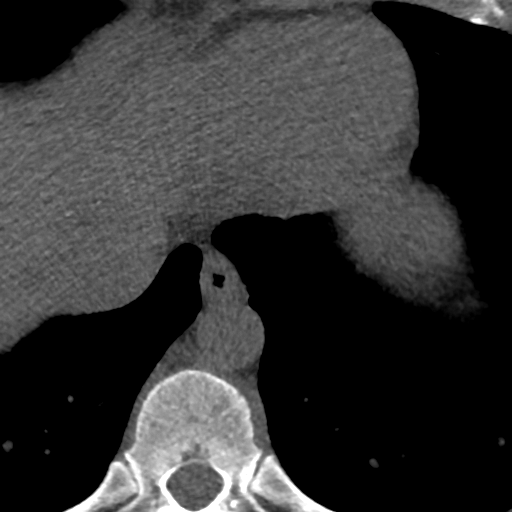
[im 19/55  vessel]
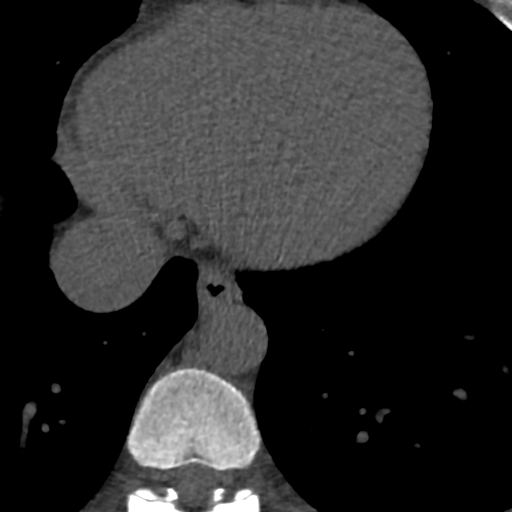
[im 25/55  vessel]
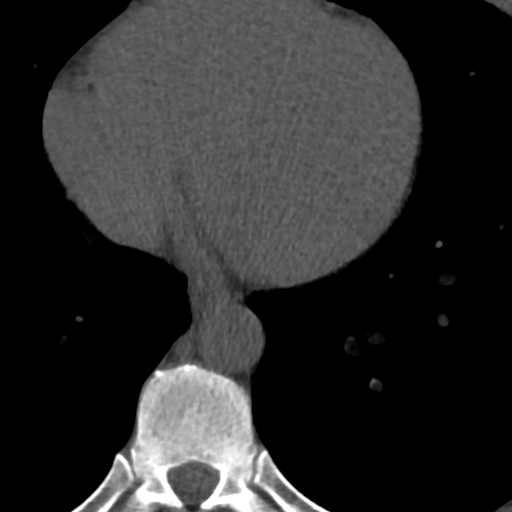
[im 31/55  vessel]
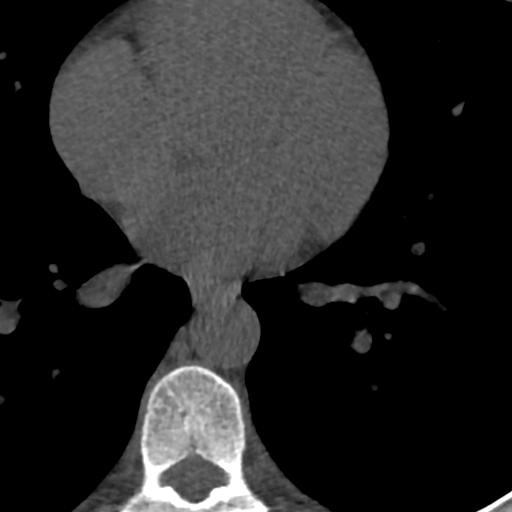
[im 31/55  lung]
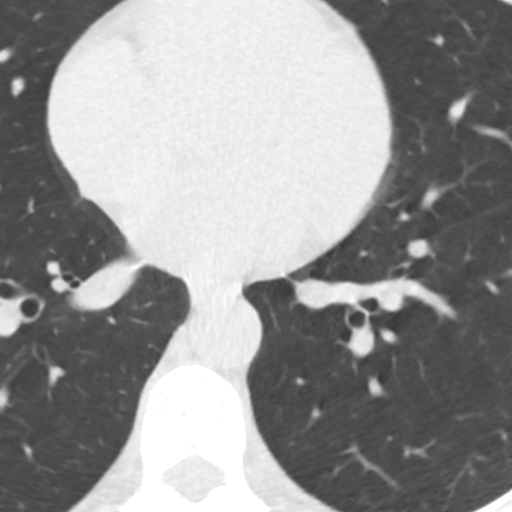
[im 37/55  vessel]
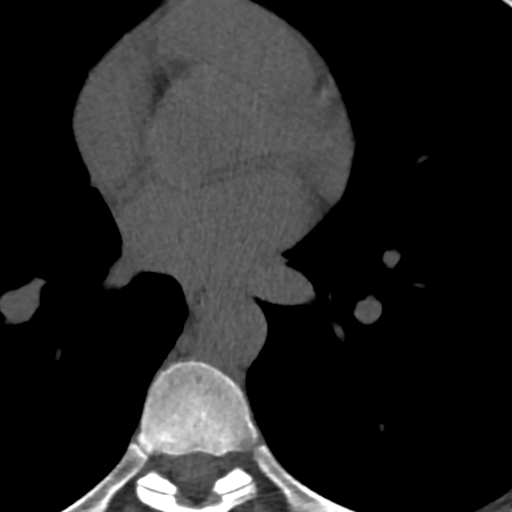
[im 43/55  vessel]
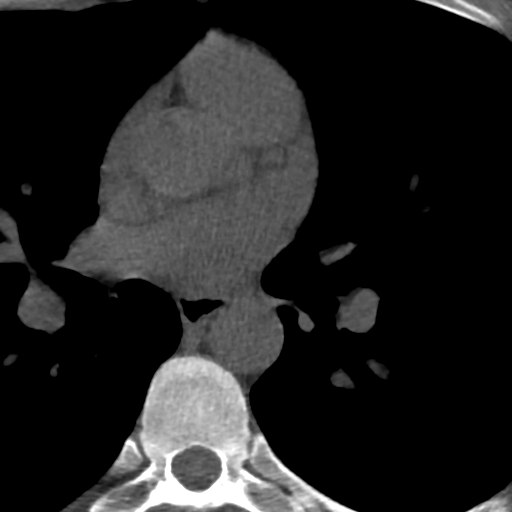
[im 49/55  vessel]
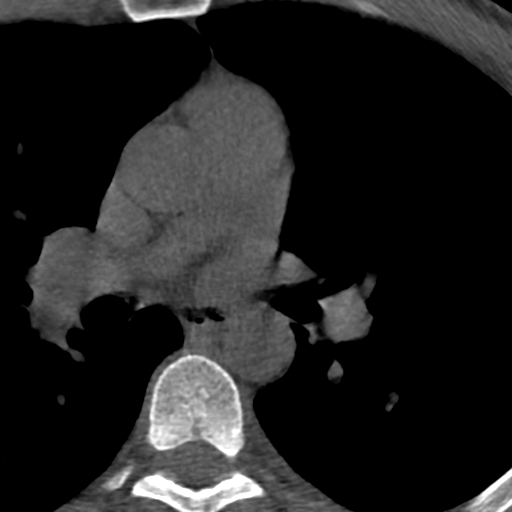

[Series 5: lung st 72 % · axial · 0.68mm/px · z∈[+984,+1110]mm · 8 of 55 slices shown]
[im 7/55  lung]
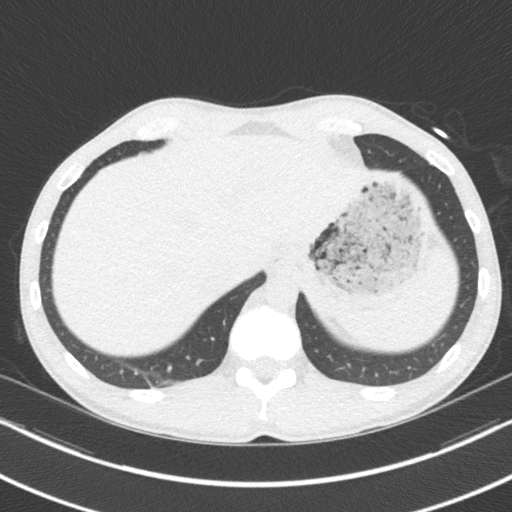
[im 13/55  lung]
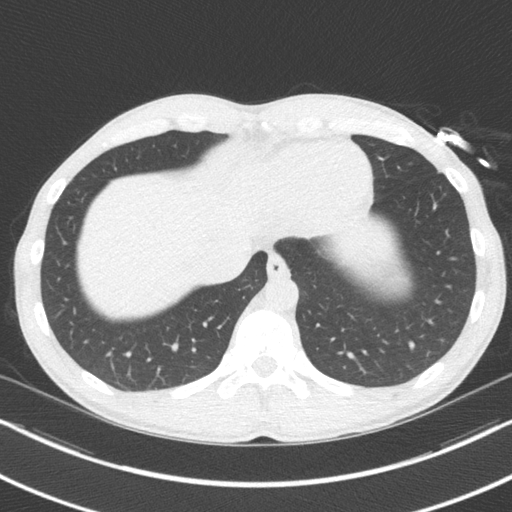
[im 19/55  lung]
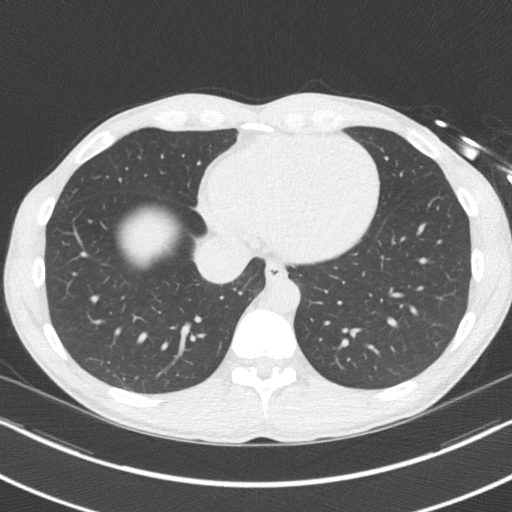
[im 25/55  lung]
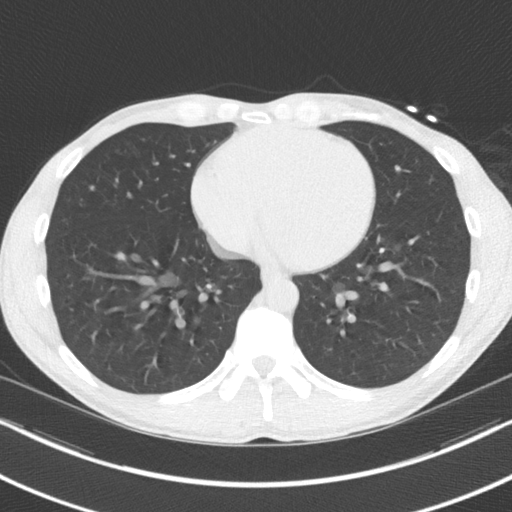
[im 31/55  lung]
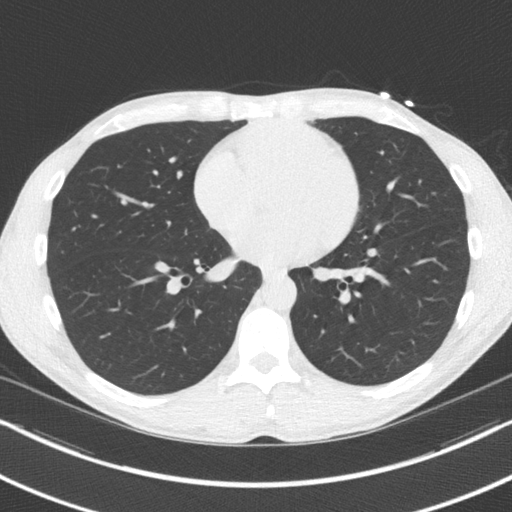
[im 37/55  lung]
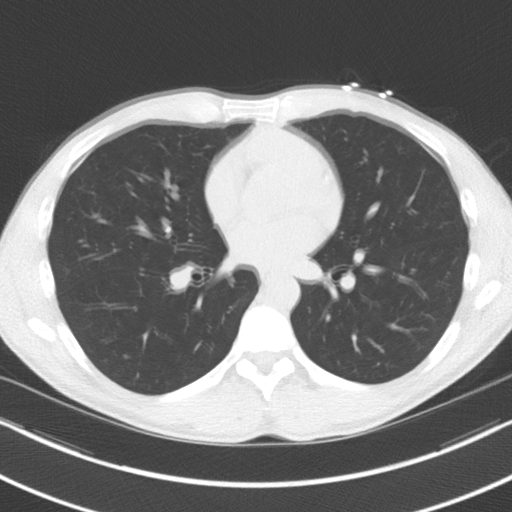
[im 43/55  lung]
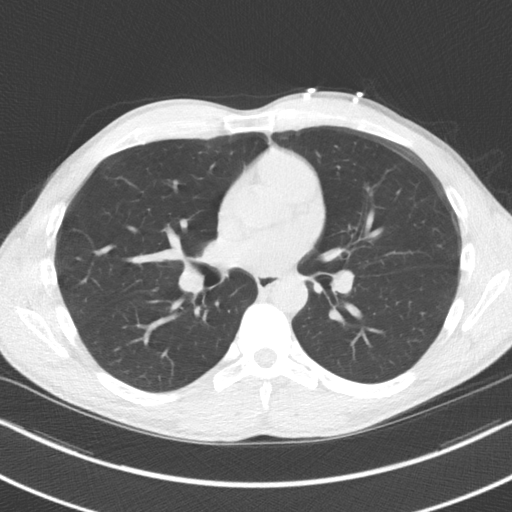
[im 49/55  lung]
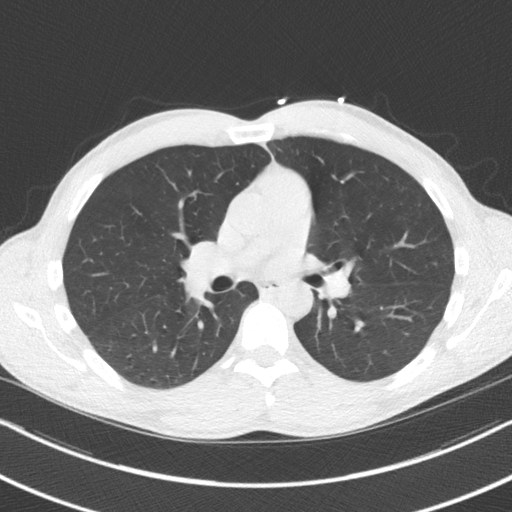

[16 of 20 positions shown; findings below may reference images not displayed]

FINDINGS: Non-cardiac: See separate report from [REDACTED].

Ascending Aorta:  Normal size.  Trivial calcifications.

Pericardium: Normal.

Coronary arteries:  Normal origin.
IMPRESSION: Coronary calcium score of 10. This was 77 percentile for age and sex
matched control.

EXAM:
OVER-READ INTERPRETATION  CT CHEST

The following report is an over-read performed by radiologist Dr.
Ingunn Harpa Ronlor [REDACTED] on 05/16/2017. This over-read
does not include interpretation of cardiac or coronary anatomy or
pathology. The coronary calcium score interpretation by the
cardiologist is attached.
FINDINGS: Vascular: Heart is normal size.  Aorta is normal caliber.

Mediastinum/Nodes: No adenopathy in the lower mediastinum or hila.

Lungs/Pleura: Calcified granulomas in the right upper lobe. No
confluent airspace opacities or effusions.

Upper Abdomen: Imaging into the upper abdomen shows no acute
findings.

Musculoskeletal: Chest wall soft tissues are unremarkable. No acute
bony abnormality.
IMPRESSION: Old granulomatous disease in the right upper lobe.

No acute extra cardiac abnormality.

## 2019-03-23 ENCOUNTER — Other Ambulatory Visit: Payer: Self-pay

## 2019-03-23 ENCOUNTER — Ambulatory Visit: Payer: 59 | Admitting: Sports Medicine

## 2019-03-23 VITALS — BP 124/72 | Ht 73.0 in | Wt 177.0 lb

## 2019-03-23 DIAGNOSIS — M25551 Pain in right hip: Secondary | ICD-10-CM | POA: Insufficient documentation

## 2019-03-23 NOTE — Assessment & Plan Note (Addendum)
Patient's history and exam consistent with gluteus medius strain.  Will have patient work on gluteus medius and hip abductor exercises to improve pain.  He was reassured that the lipoma is very superficial and is not the cause of his pain.  Continuing stretching he was doing at home.  F/U in 6 weeks if no improvement and can consider formal PT at that time.

## 2019-03-23 NOTE — Progress Notes (Signed)
Dylan Rosario is a 48 y.o. 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, has been there a few years Has been having right hip pain over the last 3 years, off and on Sometimes worsens after standing for long periods and after running, but not always Notes that he has right ankle decreased mobility and notices that his right foot turns out when he squats  Has been doing some stretching (hip flexors and IT band) and using different shoeswhich caused improvement, but now in last few weeks hasn't changed anything and the pain is coming back Pain is usually in the lateral hip, doesn't rotate into leg Feels like an ache, but close to a burn  No other history of hip pain or injury Runs about 3.5 miles a week now Lifts weights, squats, usually doesn't make the pain worse At worst 3-4/10, now is 0/10 while sitting Worsens when he leans to his left side, stretching the right When standing at desk, catches himself shifting weight to right leg frequently and tries to correct it Has not tried any PO medications, topical medications, ice, or heat Does not bother him at night   PMH reviewed. PFO with atrial septal aneurysm ROS as above. Medications reviewed.  Exam:  BP 124/72   Ht 6\' 1"  (1.854 m)   Wt 177 lb (80.3 kg)   BMI 23.35 kg/m  Gen: Well NAD MSK:  Right Hip:   - Inspection: 1.5 x 1 cm freely movable lipoma just inferior to right iliac crest.  No other gross deformity, no swelling, erythema, or ecchymosis on bilateral hips - Palpation: Mild TTP just posterior to greater trochanter on right, no TTP on left - ROM: Normal range of motion on Flexion, extension, abduction, internal and external rotation bilaterally - Strength: Normal strength bilaterally. - Neuro/vasc: NV intact distally - Special Tests: Negative FABER and FADIR.  Negative Trendelenberg.  Negative  Ober's.   No results found.   Limited US: small lipoma visualized in transverse and longitudinal plane without extension into fascia, all borders visualized   Assessment and Plan: 1) Right hip pain Patient's history and exam consistent with gluteus medius strain.  Will have patient work on gluteus medius and hip abductor exercises to improve pain.  He was reassured that the lipoma is very superficial and is not the cause of his pain.  Continuing stretching he was doing at home.  F/U in 6 weeks if no improvement and can consider formal PT at that time.   [name] Constable, D.O.  PGY-2 Family Medicine  03/23/2019 11:53 AM  Patient seen and evaluated with the resident.  I agree with the above plan of care.  Point-of-care ultrasound shows a very small lipoma without any invasion of the underlying muscle.  Patient is reassured of these findings.  Treatment as above and follow-up as needed.

## 2019-03-23 NOTE — Patient Instructions (Signed)
Your lipoma is very small and just under the surface of the skin,  It does not involve the hip joint. Your hip pain is from gluteus medius weakness.  Work on the exercises that we went over with you.  You should also work on strengthening your hip abductors as we showed you.  If over the next 6 weeks, you don't have improvement, come back and we can consider formal PT.

## 2019-04-12 ENCOUNTER — Telehealth: Payer: Self-pay | Admitting: Cardiovascular Disease

## 2019-04-12 NOTE — Telephone Encounter (Signed)
New Message  Patient is calling in requesting to speak with Valetta Fuller to get some follow up questions answered about his last appointment that he had. Please give patient a call back to discuss.

## 2019-04-12 NOTE — Telephone Encounter (Signed)
The patient called to discuss getting another calcium score and blood work prior to seeing Dr. Burt Knack.  He also wants to have a calcium score and labs ordered for his wife (who is not a patient).  Spoke to the patient at great length about next steps for his wife. Informed him she may have her PCP order the tests OR she can establish with a cardiologist to have the tests ordered.  He will call tomorrow to come up with a plan.

## 2019-04-13 NOTE — Telephone Encounter (Signed)
Returned patient's call. His wife is now scheduled for a NP visit with Cardiology. He will wait to see her plans prior to ordering/scheduling anything himself. He was grateful for assistance.

## 2019-04-13 NOTE — Telephone Encounter (Signed)
Follow Up  Pt has additional questions per the previous message.  Please call

## 2019-05-13 ENCOUNTER — Telehealth: Payer: Self-pay | Admitting: Cardiovascular Disease

## 2019-05-13 DIAGNOSIS — R0689 Other abnormalities of breathing: Secondary | ICD-10-CM

## 2019-05-13 DIAGNOSIS — R400 Somnolence: Secondary | ICD-10-CM

## 2019-05-13 DIAGNOSIS — E7800 Pure hypercholesterolemia, unspecified: Secondary | ICD-10-CM

## 2019-05-13 DIAGNOSIS — R931 Abnormal findings on diagnostic imaging of heart and coronary circulation: Secondary | ICD-10-CM

## 2019-05-13 NOTE — Telephone Encounter (Signed)
New Message    Pt is calling to speak with Dylan Rosario  He says he is calling to follow up on some test making plans     Please call

## 2019-05-14 NOTE — Telephone Encounter (Signed)
Left message to call back.  Discussed with Dr. Burt Knack. If the patient wants another calcium score or labs (same as last time), that is fine.

## 2019-05-14 NOTE — Telephone Encounter (Signed)
Patient is returning phone call.  °

## 2019-05-14 NOTE — Telephone Encounter (Signed)
Spoke with patient who does want to repeat his Calcium score. Pt also wants to have blood work to include a fibrinogen level, high sensitivity CRP and a apolipoprotein B.  Advised pt will need to obtain an order for the specific lab he wants to add from Dr Burt Knack.  He would like to do this before the end of the year.  Pt is also asking about having a sleep study. He says he has discussed this with Valetta Fuller before and it trying to determine from her which location would be best for him and if he needs to see a sleep specialist before having the study vs. Dr Burt Knack ordering it.  Advised I will forward this information to Dr Burt Knack and Valetta Fuller as well.

## 2019-05-18 NOTE — Telephone Encounter (Signed)
Follow Up  Pt was calling to follow up on previous note by Ellwood Dense. Says that he hasn't heard anything since last Friday and was getting concerned.  Please call to remind

## 2019-05-20 NOTE — Telephone Encounter (Signed)
This is fine. Please order NMR Lipoprofile, Apo B, CRP, fibrinogen level, and CT Calcium score. Will need to see about best way to do sleep study based on insurance coverage. Valetta Fuller can arrange this when she's back next week. thanks

## 2019-05-20 NOTE — Telephone Encounter (Signed)
Left message for patient to call back  

## 2019-05-24 NOTE — Telephone Encounter (Signed)
Patient returning call.

## 2019-05-24 NOTE — Telephone Encounter (Addendum)
Per Dr. Burt Knack, labs, CT and sleep study ordered to be arranged. EWS below.  He understands he will be called to arrange CT and PSG. He will send MyChart message when ready to arrange lab work (he does not wish to have lab work done now as his diet has been bad over the holidays). He was grateful for assistance.     Scale: 0= Would never dose 1= Slight chance of dozing 2= Moderate chance of dozing 3= High chance of dozing  What is your chance of dozing while: 1) Sitting and reading? ____2 2) Watching TV? ____1 3) Sitting, inactive in a public place (e.g.: a theater or meeting)? ____1 4) As a passenger in a car for an hour without a break? ____1 5) Lying down to rest in the afternoon when circumstances permit? ____3 6) Sitting and talking to someone? ____0 7) Sitting quietly after lunch without alcohol? ____2 8) In a car, while stopped for a few minutes in traffic? ____0  Total: ______10

## 2019-05-27 ENCOUNTER — Other Ambulatory Visit: Payer: Self-pay

## 2019-05-27 ENCOUNTER — Ambulatory Visit (INDEPENDENT_AMBULATORY_CARE_PROVIDER_SITE_OTHER)
Admission: RE | Admit: 2019-05-27 | Discharge: 2019-05-27 | Disposition: A | Discharge status: Home / Self Care | Payer: Self-pay | Source: Ambulatory Visit | Attending: Cardiovascular Disease | Admitting: Cardiovascular Disease

## 2019-05-27 DIAGNOSIS — R931 Abnormal findings on diagnostic imaging of heart and coronary circulation: Secondary | ICD-10-CM

## 2019-05-27 DIAGNOSIS — E7800 Pure hypercholesterolemia, unspecified: Secondary | ICD-10-CM

## 2019-06-22 ENCOUNTER — Other Ambulatory Visit (HOSPITAL_BASED_OUTPATIENT_CLINIC_OR_DEPARTMENT_OTHER): Payer: Self-pay

## 2019-06-22 DIAGNOSIS — R5383 Other fatigue: Secondary | ICD-10-CM

## 2019-06-22 DIAGNOSIS — R0683 Snoring: Secondary | ICD-10-CM

## 2019-06-22 DIAGNOSIS — G4710 Hypersomnia, unspecified: Secondary | ICD-10-CM

## 2019-06-24 NOTE — Telephone Encounter (Signed)
Spoke with the patient about his sleep study options. He understands he would have to come in to the office for a face to face appointment in order for [facility] to order PSG and attempt to get covered by insurance. His insurance has already denied an in-lab study ordered by Dr. Annamaria Boots. At this point, the patient states he will keep home sleep study as scheduled.  He will send a detailed lab list by the end of the day to be drawn next week.  He was grateful for assistance.

## 2019-06-25 ENCOUNTER — Ambulatory Visit (HOSPITAL_BASED_OUTPATIENT_CLINIC_OR_DEPARTMENT_OTHER): Payer: 59 | Attending: Geriatric Medicine | Admitting: Internal Medicine

## 2019-06-25 ENCOUNTER — Other Ambulatory Visit: Payer: Self-pay

## 2019-06-25 DIAGNOSIS — G4719 Other hypersomnia: Secondary | ICD-10-CM | POA: Diagnosis present

## 2019-06-25 DIAGNOSIS — G4733 Obstructive sleep apnea (adult) (pediatric): Secondary | ICD-10-CM | POA: Insufficient documentation

## 2019-06-25 DIAGNOSIS — G4710 Hypersomnia, unspecified: Secondary | ICD-10-CM

## 2019-06-25 DIAGNOSIS — R5383 Other fatigue: Secondary | ICD-10-CM

## 2019-06-25 DIAGNOSIS — R0683 Snoring: Secondary | ICD-10-CM

## 2019-06-29 DIAGNOSIS — R931 Abnormal findings on diagnostic imaging of heart and coronary circulation: Secondary | ICD-10-CM

## 2019-06-29 DIAGNOSIS — E7800 Pure hypercholesterolemia, unspecified: Secondary | ICD-10-CM

## 2019-06-30 ENCOUNTER — Other Ambulatory Visit: Payer: 59

## 2019-06-30 NOTE — Telephone Encounter (Signed)
The patient called and wishes to have available labs drawn and not schedule with Dr. Debara Pickett at this time.  Rescheduled him for fasting blood work 2/5. He was grateful for assistance.

## 2019-07-02 ENCOUNTER — Other Ambulatory Visit: Payer: 59 | Admitting: *Deleted

## 2019-07-02 ENCOUNTER — Other Ambulatory Visit: Payer: Self-pay

## 2019-07-02 DIAGNOSIS — R931 Abnormal findings on diagnostic imaging of heart and coronary circulation: Secondary | ICD-10-CM

## 2019-07-02 DIAGNOSIS — E7800 Pure hypercholesterolemia, unspecified: Secondary | ICD-10-CM

## 2019-07-03 DIAGNOSIS — R0683 Snoring: Secondary | ICD-10-CM | POA: Diagnosis not present

## 2019-07-03 NOTE — Procedures (Signed)
   Patient Name: Dylan Rosario, Dylan Rosario Date: 06/27/2019 Gender: Male D.O.B: [birth year] Age (years): 38 Referring Provider: Flo Shanks NP Height (inches): 53 Interpreting Physician: Baird Lyons MD, ABSM Weight (lbs): 178 RPSGT: Jacolyn Reedy BMI: 24 MRN: [id number] Neck Size:  CLINICAL INFORMATION Sleep Study Type: HST Indication for sleep study: Excessive Daytime Sleepiness, Fatigue, Snoring Epworth Sleepiness Score: 13  SLEEP STUDY TECHNIQUE A multi-channel overnight portable sleep study was performed. The channels recorded were: nasal airflow, thoracic respiratory movement, and oxygen saturation with a pulse oximetry. Snoring was also monitored.  MEDICATIONS Patient self administered medications include: none reported.  SLEEP ARCHITECTURE Patient was studied for 440.7 minutes. The sleep efficiency was 99.5 % and the patient was supine for 62.6%. The arousal index was 0.0 per hour.  RESPIRATORY PARAMETERS The overall AHI was 8.8 per hour, with a central apnea index of 0.0 per hour. The oxygen nadir was 93% during sleep.  CARDIAC DATA Mean heart rate during sleep was 45.7 bpm.  IMPRESSIONS - Mild obstructive sleep apnea occurred during this study (AHI = 8.8/h). - No significant central sleep apnea occurred during this study (CAI = 0.0/h). - The patient had minimal or no oxygen desaturation during the study (Min O2 = 93%) - Patient snored.  DIAGNOSIS - Obstructive Sleep Apnea (327.23 [G47.33 ICD-10])  RECOMMENDATIONS - Treatment for mild OSA is directed at symptoms. Consevative measures may include observation, weight loss and sleep position off back.  - Otherr options, including CPAP, a fitted oral appliance or ENT evaluation, would be based on clinical judgment. - Be careful with alcohol, sedatives and other CNS depressants that may worsen sleep apnea and disrupt normal sleep architecture. - Sleep hygiene should be reviewed to assess factors that may  improve sleep quality. - Weight management and regular exercise should be initiated or continued.  [Electronically signed] 07/03/2019 10:45 AM  Baird Lyons MD, ABSM Diplomate, American Board of Sleep Medicine   NPI: [id number]                          [place], [place] [facility]  ELECTRONICALLY SIGNED ON:  07/03/2019, 10:39 AM [place] PH: [phone]   FX: [phone] [place]

## 2019-07-08 LAB — LIPID PANEL
Chol/HDL Ratio: 2.4 ratio (ref 0.0–5.0)
Cholesterol, Total: 214 mg/dL — ABNORMAL HIGH (ref 100–199)
HDL: 91 mg/dL (ref >39)
LDL Chol Calc (NIH): 116 mg/dL — ABNORMAL HIGH (ref 0–99)
Triglycerides: 39 mg/dL (ref 0–149)
VLDL Cholesterol Cal: 7 mg/dL (ref 5–40)

## 2019-07-08 LAB — NMR, LIPOPROFILE
Cholesterol, Total: 223 mg/dL — ABNORMAL HIGH (ref 100–199)
HDL Particle Number: 32 umol/L (ref >=30.5)
HDL-C: 106 mg/dL (ref >39)
LDL Particle Number: 1138 nmol/L — ABNORMAL HIGH (ref <1000)
LDL Size: 21.1 nm (ref >20.5)
LDL-C (NIH Calc): 110 mg/dL — ABNORMAL HIGH (ref 0–99)
LP-IR Score: <25 (ref <=45)
Small LDL Particle Number: <90 nmol/L (ref <=527)
Triglycerides: 41 mg/dL (ref 0–149)

## 2019-07-08 LAB — APOLIPOPROTEIN B: Apolipoprotein B: 103 mg/dL — ABNORMAL HIGH (ref <90)

## 2019-07-08 LAB — FIBRINOGEN: Fibrinogen: 254 mg/dL (ref 193–507)

## 2019-07-08 LAB — COPPER, SERUM: Copper: 83 ug/dL (ref 69–132)

## 2019-07-08 LAB — HOMOCYSTEINE: Homocysteine: 7.2 umol/L (ref 0.0–14.5)

## 2019-07-08 LAB — OXIDIZED LDL: Oxidized LDL: 95 ng/mL (ref 10–170)

## 2019-08-31 ENCOUNTER — Ambulatory Visit: Payer: 59 | Admitting: Sports Medicine

## 2019-08-31 ENCOUNTER — Other Ambulatory Visit: Payer: Self-pay

## 2019-08-31 VITALS — BP 122/64 | Ht 73.0 in | Wt 178.0 lb

## 2019-08-31 DIAGNOSIS — M25561 Pain in right knee: Secondary | ICD-10-CM

## 2019-08-31 MED ORDER — NITROGLYCERIN 0.2 MG/HR TD PT24: MEDICATED_PATCH | TRANSDERMAL | 1 refills | Status: DC

## 2019-08-31 NOTE — Progress Notes (Signed)
PCP: Ardith Dark, PA-C  Subjective:   HPI: Patient is a 49 y.o. male here for right knee pain.  He has a 4 week history of right antermedial knee pain at the distal vastus medialis that began while playing with his children, he is not sure if he twisted it or not. The pain was only with knee flexion and worse at the bottom of a squat. Then about 2 weeks ago he had an intense workout with squats that exacerbated the pain to the point that it continued to have moderate pain at rest, into the night while in bed. Since that time he has avoided squatting and has had moderate improvement in the pain, denies pain today. He denies any swelling or redness at the site of pain. Denies instability of the knee or popping/clicking. He also has a history of left knee pain of similar location and quality that was found to have left quadricep calcific tendinitis.  Past Medical History:  Diagnosis Date  . 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 Anxiety   . Memory loss   . PFO (patent foramen ovale)   . Vision abnormalities     Past Surgical History:  Procedure Laterality Date  . TEE WITHOUT CARDIOVERSION N/A 10/06/2014   Procedure: TRANSESOPHAGEAL ECHOCARDIOGRAM (TEE);  Surgeon: Josue Hector, MD;  Location: [facility] ENDOSCOPY;  Service: Cardiovascular;  Laterality: N/A;  . WISDOM TOOTH EXTRACTION      Allergies  Allergen Reactions  . Penicillins Diarrhea    Family History  Problem Relation Age of Onset  . Stroke Mother   . Lung cancer Father   . Colon cancer Neg Hx   . Stomach cancer Neg Hx   . Esophageal cancer Neg Hx   . Rectal cancer Neg Hx   . Liver cancer Neg Hx     BP 122/64   Ht 6\' 1"  (1.854 m)   Wt 178 lb (80.7 kg)   BMI 23.48 kg/m   Review of Systems: See HPI above.     Objective:  Physical Exam:  Gen: NAD, comfortable in exam room  Right knee: No effusion, erythema, or ecchymosis.  No TTP over medial/lateral joint line, No TTP over quad or patellar  tendon FROM Strength 5/5 flexion and extension Valgus/Varus stress: Neg Ant drawer/lachmans: Neg Post Drawer: Neg McMurrays: Neg Thessalys: Neg    Assessment & Plan:  1. Right calcific quad tendinitis: Patient has 1 month hx right anteromedial pain localized over the vastus medialis that is worse with squatting. He has a history of similar symptoms in the left leg that was found to have calcific tendinitis on U/S. On U/S today, right quad calcifications were evident.  -- Quadriceps exercises: decline squat, avoid deep squatting for 1 month -- Nitro patch: 1/4 patch over affected area daily for 1 month -- Wear right knee compression sleeve during activity for 1 month -- OTC voltaren gel twice daily for 2 weeks -- Follow up prn  Patient seen and evaluated with the sports medicine fellow.  I agree with the above plan of care.  Patient's physical exam is fairly unremarkable today.  Ultrasound findings as below.  Proceed with treatment as above and increase activity as tolerated.  Follow-up for ongoing or recalcitrant issues.  MSK ultrasound of the right knee was performed today.  Limited images were obtained.  No significant joint effusion was seen.  Visualized portions of the medial and lateral menisci were unremarkable.  There is significant calcification of  the quadriceps tendon at the insertion onto the patella with surrounding hypoechoic changes in the tendon.  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

## 2019-08-31 NOTE — Patient Instructions (Signed)
Nitroglycerin Protocol   Apply 1/4 nitroglycerin patch to affected area daily.  Change position of patch within the affected area every 24 hours.  You may experience a headache during the first 1-2 weeks of using the patch, these should subside.  If you experience headaches after beginning nitroglycerin patch treatment, you may take your preferred over the counter pain reliever.  Another side effect of the nitroglycerin patch is skin irritation or rash related to patch adhesive.  Please notify our office if you develop more severe headaches or rash, and stop the patch.  Tendon healing with nitroglycerin patch may require 12 to 24 weeks depending on the extent of injury.  Men should not use if taking Viagra, Cialis, or Levitra.   Do not use if you have migraines or rosacea.    . Right knee pain - quadriceps calcific tendinopathy - Decrease weight on squats - Keeps squats to 60 degrees - Decline squats 3 x 15

## 2019-10-27 DIAGNOSIS — R931 Abnormal findings on diagnostic imaging of heart and coronary circulation: Secondary | ICD-10-CM

## 2019-10-27 DIAGNOSIS — E7800 Pure hypercholesterolemia, unspecified: Secondary | ICD-10-CM

## 2019-11-03 ENCOUNTER — Other Ambulatory Visit: Payer: Self-pay

## 2019-11-03 ENCOUNTER — Other Ambulatory Visit: Payer: 59

## 2019-11-03 DIAGNOSIS — E7800 Pure hypercholesterolemia, unspecified: Secondary | ICD-10-CM

## 2019-11-03 DIAGNOSIS — R931 Abnormal findings on diagnostic imaging of heart and coronary circulation: Secondary | ICD-10-CM

## 2019-11-04 LAB — LIPID PANEL
Chol/HDL Ratio: 2.4 ratio (ref 0.0–5.0)
Cholesterol, Total: 205 mg/dL — ABNORMAL HIGH (ref 100–199)
HDL: 85 mg/dL (ref >39)
LDL Chol Calc (NIH): 111 mg/dL — ABNORMAL HIGH (ref 0–99)
Triglycerides: 46 mg/dL (ref 0–149)
VLDL Cholesterol Cal: 9 mg/dL (ref 5–40)

## 2019-11-04 LAB — APOLIPOPROTEIN B: Apolipoprotein B: 91 mg/dL — ABNORMAL HIGH (ref <90)

## 2020-08-01 ENCOUNTER — Ambulatory Visit: Payer: 59 | Admitting: Sports Medicine

## 2020-08-01 ENCOUNTER — Other Ambulatory Visit: Payer: Self-pay

## 2020-08-01 VITALS — BP 136/77 | Ht 72.0 in | Wt 176.0 lb

## 2020-08-01 DIAGNOSIS — M25511 Pain in right shoulder: Secondary | ICD-10-CM

## 2020-08-01 NOTE — Patient Instructions (Signed)
declined

## 2020-08-01 NOTE — Assessment & Plan Note (Signed)
History, presentation, exam, and ultrasound findings consistent with rotactor cuff tendonitis. - Avoid overhead and offending exercises and activities - Can continue using nitro patches for another 6 weeks - HEP given today - OTC Nsaids - F/u as needed

## 2020-08-01 NOTE — Progress Notes (Signed)
    SUBJECTIVE:   CHIEF COMPLAINT / HPI:   Right shoulder pain There is a very pleasant 50 year old male who presents today for new onset right shoulder pain.  It first began Saturday, July 22, 2018 service and overhead volleyball serves to his daughter to help her practice.  He started feeling tight in the next morning stiffness and a little bit of pain.  Pain is been consistently in the posterior area of the right shoulder with nonradiating pain and no numbness or tingling.  He never heard any pop or snap or felt any inciting event.  He has never had any trauma or major injury to the right shoulder.  He did get better over some time with some rest however this past weekend he did do a [employer] workout with lots of push-ups and then it got worse this past Thursday.  He restarted some stretching and some home exercises and has relatively improved some.  He is also used some leftover nitro patches he has before which seems to have helped.  PERTINENT  PMH / PSH: Patent foramen ovale, history of thrombocytopenia, history of syncope  OBJECTIVE:   BP 136/77   Ht 6' (1.829 m)   Wt 176 lb (79.8 kg)   BMI 23.87 kg/m   No flowsheet data found.  Shoulder, Right: No evidence of bony deformity, asymmetry, or muscle atrophy; No tenderness over long head of biceps (bicipital groove). No TTP at [facility] joint. Full active and passive range of motion but painful when going into extension and internal rotation, Thumb to T12 with mild tenderness. Strength 5/5 throughout. No abnormal scapular function observed. Sensation intact. Peripheral pulses intact. Special Tests:   - Crossarm test: NEG - Jobe test: NEG but some pain   - Hawkins: NEG   - Neer test: NEG   - Belly press test: NEG   - Drop arm test: NEG  [place] shoulder: -Biceps tendon: Well visualized within the bicipital groove.  There is no calcification in the proximal biceps. Minimal fluid seen within the tendon sheath. -Pectoralis: Insertion visualized  and without abnormalities. -Subscapularis: Well visualized to insertion point on humerus.  No abnormalities.  Dynamic testing over the coracoid did not show signs of impingement. -AC joint: No osteophytes and no fluid collection, no significant separation -Supraspinatus: small area of disruption seen within the supraspinatous  At the mid portion of the undersurface. Dynamic testing did not reveal signs of impingement. -Subacromial bursa: No bursal swelling -Infraspinatus/teres minor: Insertion point on posterior humerus visualized and without abnormalities. Conclusion: Small area of undersurface supraspinatus hypoechoic change representing chronic changes. No tears.    ASSESSMENT/PLAN:   Right shoulder pain History, presentation, exam, and ultrasound findings consistent with rotactor cuff tendonitis. - Avoid overhead and offending exercises and activities - Can continue using nitro patches for another 6 weeks - HEP given today - OTC Nsaids - F/u as needed     Nuala Alpha, DO PGY-4, Sports Medicine Fellow [place]  Patient seen and evaluated with the sports medicine fellow.  I agree with the above plan of care.  Treatment as above.  If symptoms persist or worsen, consider subacromial cortisone injection versus further diagnostic imaging.  Follow-up for ongoing or recalcitrant issues.

## 2020-12-19 ENCOUNTER — Ambulatory Visit
Admission: RE | Admit: 2020-12-19 | Discharge: 2020-12-19 | Disposition: A | Discharge status: Home / Self Care | Payer: 59 | Source: Ambulatory Visit | Attending: Sports Medicine | Admitting: Sports Medicine

## 2020-12-19 ENCOUNTER — Other Ambulatory Visit: Payer: Self-pay

## 2020-12-19 ENCOUNTER — Ambulatory Visit: Payer: 59 | Admitting: Sports Medicine

## 2020-12-19 VITALS — Ht 72.0 in | Wt 172.0 lb

## 2020-12-19 DIAGNOSIS — M79642 Pain in left hand: Secondary | ICD-10-CM

## 2020-12-19 DIAGNOSIS — M2021 Hallux rigidus, right foot: Secondary | ICD-10-CM | POA: Diagnosis not present

## 2020-12-19 DIAGNOSIS — M25511 Pain in right shoulder: Secondary | ICD-10-CM | POA: Diagnosis not present

## 2020-12-19 NOTE — Patient Instructions (Signed)
Schedule an appt with me for next Tuesday @ 11a for a left shoulder ultrasound

## 2020-12-20 NOTE — Progress Notes (Signed)
Subjective:    Patient ID: Dylan Rosario, male    DOB: Jul 21, 1970, 50 y.o.   MRN: [id number]  HPI chief complaint: Left hand pain, right shoulder pain, and right great toe pain  Mukesh presents today with several complaints.  His main complaint is left hand pain and swelling that began yesterday when he struck the dorsum of his hand against the wall in a shower.  He had some pain initially but later on that night developed increasing pain and swelling that was severe enough that he thought about going to urgent care.  He self treated with ice and oral NSAIDs and his pain has improved overnight.  Pain is all localized to the ulnar aspect of the proximal hand.  He denies any previous injury to this hand in the past.  He does endorse some tingling in the area also.  He is also describing persistent right shoulder pain.  He initially injured the shoulder playing volleyball back in February.  A point-of-care ultrasound done in March earlier this year showed some undersurface tearing of the supraspinatus.  He was treated with topical nitroglycerin and home exercises but despite that he continues to have pain.  He localizes his pain to the lateral shoulder.  It is most noticeable when lifting away from his body.  He has had to modify his exercise routine as a result of his pain.  He localizes it primarily to the posterior shoulder.  He also endorses pain at night when sleeping on his right side.  Pain does not radiate into his arm.  No numbness or tingling.  He also has pain in the first MTP joint of the right foot.  He is worried about possible bunion formation.  It does not really interfere with his activity but it does get sore.  He denies any recent or previous trauma to this area.  Intra medical history reviewed Medications reviewed Allergies reviewed    Review of Systems As above    Objective:   Physical Exam  Well-developed, well-nourished.  No acute distress  Left hand: There is  a mild amount of ecchymosis and swelling at the proximal ulnar aspect of the left hand, focused around the base of the fourth and fifth metacarpals.  He is tender to palpation in this area.  He does have full range of motion at the wrist and at the MCP joints but does get some discomfort with this.  Good pulses.  Right shoulder: Full range of motion.  No tenderness to palpation.  Positive empty can, positive Hawkins.  Rotator cuff strength is 5/5 but does reproduce pain with resisted supraspinatus.  Negative O'Brien's but reproducible pain with putting the arm in an abducted and externally rotated position.  Neurovascular intact distally.  Right great toe: There is bony hypertrophy and hallux valgus of the first MTP joint.  Some tenderness to palpation as well.  No effusion.  No obvious bunion deformity.  X-rays of the left hand including AP, lateral, and oblique views show no acute bony abnormality.  X-rays of the right shoulder including AP and axillary views show some mild glenohumeral degenerative changes but not severe.  Nothing acute.  Please note that radiology reading was pending at the time of this dictation.      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  For his right shoulder, we will order an MRI arthrogram to further evaluate his rotator cuff and labrum.  I discussed wearing stiff soled shoes for his first MTP osteoarthritis as well as trying some topical treatments such as Voltaren gel or capsaicin as needed.  They will follow-up in 1 week for reevaluation of his left hand.  Of note, he is getting some pain in his left shoulder as well and would like to have an ultrasound evaluation of this done at that visit.

## 2020-12-26 ENCOUNTER — Other Ambulatory Visit: Payer: Self-pay

## 2020-12-26 ENCOUNTER — Ambulatory Visit: Payer: 59 | Admitting: Sports Medicine

## 2020-12-26 VITALS — Ht 73.0 in | Wt 172.0 lb

## 2020-12-26 DIAGNOSIS — M79642 Pain in left hand: Secondary | ICD-10-CM | POA: Diagnosis not present

## 2020-12-26 DIAGNOSIS — M25512 Pain in left shoulder: Secondary | ICD-10-CM

## 2020-12-26 DIAGNOSIS — G8929 Other chronic pain: Secondary | ICD-10-CM | POA: Diagnosis not present

## 2020-12-26 NOTE — Progress Notes (Signed)
   Subjective:    Patient ID: Dylan Rosario, male    DOB: June 20, 1970, 50 y.o.   MRN: [id number]  HPI   [name] comes in today for follow-up on left hand pain secondary to contusion.  Pain has improved.  Still some slight discomfort but much better than last week.  X-rays showed no obvious fracture.  He is also here to discuss left shoulder pain.  He has an MRI arthrogram ordered and scheduled for tomorrow on the right shoulder to evaluate for labral tear versus rotator cuff tear.  His left shoulder pain is localized to the [facility] joint.  He notices it primarily with exercises that load the glenohumeral joint such as push-ups.  No recent trauma.    Review of Systems As above    Objective:   Physical Exam  Well-developed, well-nourished.  No acute distress  Left hand: Small palpable hematoma.  Minimal tenderness palpation.  Full range of motion at the wrist and hand.  Neurovascular intact distally.  Left shoulder: Full painless range of motion.  He is slightly tender to palpation over the [facility] joint with a positive crossarm adduction test.  Negative empty can, negative Hawkins.  Negative O'Brien's.  Rotator cuff strength is 5/5 and does not reproduce pain.  Neurovascularly intact distally.  Point-of-care ultrasound of the left shoulder shows small amount of fluid and a small spur at the [facility] joint.  Visualized portions of the rotator cuff show no obvious tearing.      Assessment & Plan:   Improving left hand pain secondary to contusion Left shoulder pain secondary to mild AC joint arthropathy  Reassurance regarding the left hand.  He may continue with activity as tolerated in regards to the left shoulder.  I will call him with the MRI arthrogram results of the right shoulder next week when available.  We will delineate further work-up and treatment there based on those findings.

## 2020-12-27 ENCOUNTER — Ambulatory Visit
Admission: RE | Admit: 2020-12-27 | Discharge: 2020-12-27 | Disposition: A | Discharge status: Home / Self Care | Payer: 59 | Source: Ambulatory Visit | Attending: Sports Medicine | Admitting: Sports Medicine

## 2020-12-27 ENCOUNTER — Other Ambulatory Visit: Payer: 59

## 2020-12-27 DIAGNOSIS — M25511 Pain in right shoulder: Secondary | ICD-10-CM

## 2020-12-27 MED ORDER — IOPAMIDOL (ISOVUE-M 200) INJECTION 41%
13.0000 mL | Freq: Once | INTRAMUSCULAR | Status: AC
  Administered 2020-12-27: 13 mL via INTRA_ARTICULAR

## 2021-01-03 ENCOUNTER — Telehealth: Payer: Self-pay | Admitting: Sports Medicine

## 2021-01-03 NOTE — Telephone Encounter (Signed)
I spoke with Dylan Rosario on the phone yesterday after reviewing the MRI arthrogram of his right shoulder which was recently done.  There is no evidence of a labral tear.  He has mild to moderate supraspinatus tendinosis and a partial-thickness tear of the distal subscapularis.  Based on these findings I recommend we continue with conservative treatment for now.  I will schedule formal physical therapy with Angelia Mould.  Patient also has some nitroglycerin patches from a previous injury.  He will start a quarter patch daily.  If he continues to have significant pain after completing physical therapy then we may need to consider surgical referral.  We will follow-up for ongoing or recalcitrant issues.

## 2022-10-29 ENCOUNTER — Ambulatory Visit: Payer: 59 | Admitting: Sports Medicine

## 2023-09-05 ENCOUNTER — Telehealth: Payer: Self-pay | Admitting: Cardiovascular Disease

## 2023-09-05 NOTE — Telephone Encounter (Addendum)
 Spoke with patient and he was calling to see if he can speak with [facility] Dr. Earmon Phoenix nurse about ordering some test for him.  Patient has not been seen since 2019. Informed patient he will need an appointment and is considered a new patient being that it has been so long since last OV.  He verbalized understanding.  Appointment scheduled

## 2023-09-05 NOTE — Telephone Encounter (Signed)
 Pt called in stated he wanted to talk to Dr Excell Seltzer nurse about "somethings" he would not give any details and denied currently having any symptoms.     Best number [phone]

## 2023-09-15 ENCOUNTER — Encounter: Payer: Self-pay | Admitting: Cardiovascular Disease

## 2023-09-15 ENCOUNTER — Ambulatory Visit: Attending: Cardiovascular Disease | Admitting: Cardiovascular Disease

## 2023-09-15 VITALS — BP 118/78 | HR 65 | Resp 98 | Ht 73.0 in | Wt 174.8 lb

## 2023-09-15 DIAGNOSIS — R931 Abnormal findings on diagnostic imaging of heart and coronary circulation: Secondary | ICD-10-CM

## 2023-09-15 DIAGNOSIS — R42 Dizziness and giddiness: Secondary | ICD-10-CM | POA: Diagnosis not present

## 2023-09-15 DIAGNOSIS — Q2112 Patent foramen ovale: Secondary | ICD-10-CM

## 2023-09-15 DIAGNOSIS — E7841 Elevated Lipoprotein(a): Secondary | ICD-10-CM

## 2023-09-15 NOTE — Progress Notes (Signed)
 Cardiology Office Note:    Date:  09/15/2023   ID:  Dylan Rosario, DOB 30-Dec-1970, MRN [id number]  PCP:  Bonita Bussing   [facility] Providers Cardiologist:  Arnoldo Lapping, MD     Referring MD: Majorie Scrape, PA-C   Chief Complaint  Patient presents with   Follow-up    History of Present Illness:    Dylan Rosario is a 53 y.o. male with a hx of mixed hyperlipidemia, presenting for follow-up evaluation.  Patient also has a history of elevated coronary calcium score.  He has strongly favor nonstatin therapies and has not been on any prescription medication for his lipids.  He had a coronary Ca score of 31 in 2000, placing him in the 80th percentile for an age and gender matched cohort.  He remains very physically fit.  He brings in home blood pressures and they are in an excellent range with systolic readings ranging from the 90s up to about 120 mmHg.  Most of his blood pressure readings are in the range of 100 to 110 mmHg systolic.  He does have a feeling of "brain fog" with certain exercises and post exercise.  He denies chest pain or shortness of breath.  He has had some dizzy spells and lightheaded spells both with exercise and postexercise.  He has not had frank syncope.  Current Medications: Current Meds  Medication Sig   Ascorbic Acid (VITAMIN C) 1000 MG tablet Take 1,000 mg daily by mouth.    aspirin 81 MG tablet Take 81 mg by mouth daily.   cholecalciferol (VITAMIN D ) 1000 units tablet Take 4,000 Units by mouth daily.    ketoconazole (NIZORAL) 2 % cream Apply 1 application  topically as needed.   ketoconazole (NIZORAL) 2 % shampoo Apply 1 application as directed topically.    Menaquinone-7 (VITAMIN K2 PO) Take by mouth daily. One every third day   Omega-3 1000 MG CAPS Take 1,000 mg by mouth 2 (two) times daily.    TURMERIC CURCUMIN PO Take 1 tablet by mouth daily.   Zinc 30 MG CAPS Take 1 capsule by mouth daily.   Current  Facility-Administered Medications for the 09/15/23 encounter (Office Visit) with Arnoldo Lapping, MD  Medication   0.9 %  sodium chloride  infusion     Allergies:   Penicillins   ROS:   Please see the history of present illness.    All other systems reviewed and are negative.  EKGs/Labs/Other Studies Reviewed:    The following studies were reviewed today: Cardiac Studies & Procedures   ______________________________________________________________________________________________       TEE  ECHO TEE 10/06/2014  Narrative *[place]* *[facility]* [address] [place], [place] [address] [phone]  ------------------------------------------------------------------- Transesophageal Echocardiography  Patient:    Rosario, Dylan MR #:       [id number] Study Date: 10/06/2014 Gender:     M Age:        43 Height:     185.4 cm Weight:     84.1 kg BSA:        2.09 m^2 Pt. Status: Room:  PERFORMING   Janelle Mediate, M.D. ADMITTING    Peder Bourdon, M.D. ATTENDING    Peder Bourdon, M.D. ORDERING     Peder Bourdon, M.D. SONOGRAPHER  Lauren Pennington, RDCS, CCT  cc:  -------------------------------------------------------------------  ------------------------------------------------------------------- Indications:      Patent foramen ovale 745.5.  ------------------------------------------------------------------- Study Conclusions  - Impressions: Normal EF 60% Normal valves No aortic debris  No effusion No LAA thrombus  Redundant and aneurysmal atrial septum No fenestrations. PFO not seen by 2D and color. However bubble study positive x2 with coughing and valsalva. No obvious sinus venosis defect or anomalous veins although the right nferior PV not well seen.  This would not be typical of lesion that wouild be thought to be concerning for CNS events  Janelle Mediate MD [facility]  Impressions:  - Normal EF 60% Normal valves No aortic  debris No effusion No LAA thrombus  Redundant and aneurysmal atrial septum No fenestrations. PFO not seen by 2D and color. However bubble study positive x2 with coughing and valsalva. No obvious sinus venosis defect or anomalous veins although the right nferior PV not well seen.  This would not be typical of lesion that wouild be thought to be concerning for CNS events  Janelle Mediate MD [facility].  Diagnostic transesophageal echocardiography.  2D and color Doppler. Birthdate:  Patient birthdate: [birth year].  Age:  Patient is 53 yr old.  Sex:  Gender: male.    BMI: 24.5 kg/m^2.  Blood pressure: 128/80  Patient status:  Outpatient.  Study date:  Study date: 10/06/2014. Study time: 08:53 AM.  Location:  Endoscopy.  -------------------------------------------------------------------  ------------------------------------------------------------------- Prepared and Electronically Authenticated by  Janelle Mediate, M.D. 2016-05-12T16:56:48    CT SCANS  CT CARDIAC SCORING (SELF PAY ONLY) 05/27/2019  Addendum 05/27/2019  2:30 PM ADDENDUM REPORT: 05/27/2019 14:27  CLINICAL DATA:  Risk stratification  EXAM: Coronary Calcium Score  TECHNIQUE: The patient was scanned on a [employer] scanner. Axial non-contrast 3 mm slices were carried out through the heart. The data set was analyzed on a dedicated work station and scored using the Agatson method.  FINDINGS: Non-cardiac: See separate report from [facility].  Ascending Aorta: Normal size, no calcifications.  Pericardium: Normal.  Coronary arteries: Normal origin.  IMPRESSION: Coronary calcium score of 31. This was 59 percentile for age and sex matched control.   Electronically Signed By: Christoper Crafts On: 05/27/2019 14:27  Narrative EXAM: OVER-READ INTERPRETATION  CT CHEST  The following report is an over-read performed by radiologist Dr. Janeece Mechanic of [facility] on 05/27/2019.  This over-read does not include interpretation of cardiac or coronary anatomy or pathology. The coronary calcium score interpretation by the cardiologist is attached.  COMPARISON:  05/16/2017  FINDINGS: Vascular: Heart is normal size.  Visualized aorta normal caliber.  Mediastinum/Nodes: No adenopathy in the lower mediastinum or hila.  Lungs/Pleura: No confluent opacities or effusions.  Upper Abdomen: Imaging into the upper abdomen shows no acute findings.  Musculoskeletal: Chest wall soft tissues are unremarkable. No acute bony abnormality.  IMPRESSION: No acute or significant extracardiac abnormality.  Electronically Signed: By: Janeece Mechanic M.D. On: 05/27/2019 14:17   CT SCANS  CT CARDIAC SCORING (SELF PAY ONLY) 05/16/2017  Addendum 05/16/2017 11:15 AM ADDENDUM REPORT: 05/16/2017 11:13  CLINICAL DATA:  Risk stratification  EXAM: Coronary Calcium Score  TECHNIQUE: The patient was scanned on a [employer] scanner. Axial non-contrast 3 mm slices were carried out through the heart. The data set was analyzed on a dedicated work station and scored using the Agatson method.  FINDINGS: Non-cardiac: See separate report from [facility].  Ascending Aorta:  Normal size.  Trivial calcifications.  Pericardium: Normal.  Coronary arteries:  Normal origin.  IMPRESSION: Coronary calcium score of 10. This was 21 percentile for age and sex matched control.   Electronically Signed By: Christoper Crafts On: 05/16/2017 11:13  Narrative EXAM: OVER-READ  INTERPRETATION  CT CHEST  The following report is an over-read performed by radiologist Dr. Asenath Blacker [facility] on 05/16/2017. This over-read does not include interpretation of cardiac or coronary anatomy or pathology. The coronary calcium score interpretation by the cardiologist is attached.  COMPARISON:  None.  FINDINGS: Vascular: Heart is normal size.  Aorta is normal  caliber.  Mediastinum/Nodes: No adenopathy in the lower mediastinum or hila.  Lungs/Pleura: Calcified granulomas in the right upper lobe. No confluent airspace opacities or effusions.  Upper Abdomen: Imaging into the upper abdomen shows no acute findings.  Musculoskeletal: Chest wall soft tissues are unremarkable. No acute bony abnormality.  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  No acute extra cardiac abnormality.  Electronically Signed: By: Janeece Mechanic M.D. On: 05/16/2017 10:12     ______________________________________________________________________________________________      EKG:   EKG Interpretation Date/Time:  Monday September 15 2023 14:26:36 EDT Ventricular Rate:  65 PR Interval:  148 QRS Duration:  88 QT Interval:  384 QTC Calculation: 399 R Axis:   92  Text Interpretation: Normal sinus rhythm Rightward axis No previous ECGs available Confirmed by Arnoldo Lapping [phone]) on 09/15/2023 4:59:08 PM    Recent Labs: No results found for requested labs within last 365 days.  Recent Lipid Panel    Component Value Date/Time   CHOL 205 (H) 11/03/2019 0909   TRIG 46 11/03/2019 0909   HDL 85 11/03/2019 0909   CHOLHDL 2.4 11/03/2019 0909   LDLCALC 111 (H) 11/03/2019 0909     Risk Assessment/Calculations:                Physical Exam:    VS:  BP 118/78   Pulse 65   Resp (!) 98   Ht 6\' 1"  (1.854 m)   Wt 174 lb 12.5 oz (79.3 kg)   BMI 23.06 kg/m     Wt Readings from Last 3 Encounters:  09/15/23 174 lb 12.5 oz (79.3 kg)  12/26/20 172 lb (78 kg)  12/19/20 172 lb (78 kg)     GEN:  Well nourished, well developed in no acute distress HEENT: Normal NECK: No JVD; No carotid bruits LYMPHATICS: No lymphadenopathy CARDIAC: RRR, no murmurs, rubs, gallops RESPIRATORY:  Clear to auscultation without rales, wheezing or rhonchi  ABDOMEN: Soft, non-tender, non-distended MUSCULOSKELETAL:  No edema; No deformity  SKIN: Warm and  dry NEUROLOGIC:  Alert and oriented x 3 PSYCHIATRIC:  Normal affect   Assessment & Plan PFO (patent foramen ovale) Past evaluation done.  Patient with small PFO and atrial septal aneurysm.  Patient takes low-dose aspirin without complication. Elevated lipoprotein(a) He has been noted to have elevated LP(a) of 112 in the past.  His total cholesterol at last check was 205 with an LDL particle number of 1138.  LDL cholesterol was 111.  Will plan to update a lipid panel.  Likely will order an NMR study.  He is going to send some other labs that he would like to have drawn so we will wait on ordering any labs until we receive the information from him. Dizziness Brought on by exercise and sometimes with certain head movements.  I recommended to carotid ultrasound to evaluate carotid and vertebral flow and make sure that he does not have significant carotid stenosis.  Patient notes that he has a family history of carotid stenosis and his mom had bilateral carotid disease. Elevated coronary artery calcium score Patient with an elevated coronary calcium score about 5 years  ago.  He has not been on statin therapy.  He takes multiple nutraceuticals.  Will update his calcium score and help guide pharmacotherapy.            Medication Adjustments/Labs and Tests Ordered: Current medicines are reviewed at length with the patient today.  Concerns regarding medicines are outlined above.  Orders Placed This Encounter  Procedures   CT CARDIAC SCORING (SELF PAY ONLY)   EKG 12-Lead   VAS US  CAROTID   No orders of the defined types were placed in this encounter.   Patient Instructions  Testing/Procedures: Carotid Ultrasound Your physician has requested that you have a carotid duplex. This test is an ultrasound of the carotid arteries in your neck. It looks at blood flow through these arteries that supply the brain with blood. Allow one hour for this exam. There are no restrictions or special  instructions.  Coronary Calcium Score CT Cardiac CT scanning, (CAT scanning), is a noninvasive, special x-ray that produces cross-sectional images of the body using x-rays and a computer. CT scans help physicians diagnose and treat medical conditions. For some CT exams, a contrast material is used to enhance visibility in the area of the body being studied. CT scans provide greater clarity and reveal more details than regular x-ray exams.  Follow-Up: At [facility], you and your health needs are our priority.  As part of our continuing mission to provide you with exceptional heart care, our providers are all part of one team.  This team includes your primary Cardiologist (physician) and Advanced Practice Providers or APPs (Physician Assistants and Nurse Practitioners) who all work together to provide you with the care you need, when you need it.  Your next appointment:   1 year(s)  Provider:   Arnoldo Lapping, MD           1st Floor: - Lobby - Registration  - Pharmacy  - Lab - Cafe  2nd Floor: - PV Lab - Diagnostic Testing (echo, CT, nuclear med)  3rd Floor: - Vacant  4th Floor: - TCTS (cardiothoracic surgery) - AFib Clinic - Structural Heart Clinic - Vascular Surgery  - Vascular Ultrasound  5th Floor: - HeartCare Cardiology (general and EP) - Clinical Pharmacy for coumadin, hypertension, lipid, weight-loss medications, and med management appointments    Valet parking services will be available as well.     Signed, Arnoldo Lapping, MD  09/15/2023 4:59 PM    [facility]

## 2023-09-15 NOTE — Assessment & Plan Note (Signed)
 He has been noted to have elevated LP(a) of 112 in the past.  His total cholesterol at last check was 205 with an LDL particle number of 1138.  LDL cholesterol was 111.  Will plan to update a lipid panel.  Likely will order an NMR study.  He is going to send some other labs that he would like to have drawn so we will wait on ordering any labs until we receive the information from him.

## 2023-09-15 NOTE — Assessment & Plan Note (Signed)
 Patient with an elevated coronary calcium score about 5 years ago.  He has not been on statin therapy.  He takes multiple nutraceuticals.  Will update his calcium score and help guide pharmacotherapy.

## 2023-09-15 NOTE — Patient Instructions (Signed)
 Testing/Procedures: Carotid Ultrasound Your physician has requested that you have a carotid duplex. This test is an ultrasound of the carotid arteries in your neck. It looks at blood flow through these arteries that supply the brain with blood. Allow one hour for this exam. There are no restrictions or special instructions.  Coronary Calcium Score CT Cardiac CT scanning, (CAT scanning), is a noninvasive, special x-ray that produces cross-sectional images of the body using x-rays and a computer. CT scans help physicians diagnose and treat medical conditions. For some CT exams, a contrast material is used to enhance visibility in the area of the body being studied. CT scans provide greater clarity and reveal more details than regular x-ray exams.  Follow-Up: At [facility], you and your health needs are our priority.  As part of our continuing mission to provide you with exceptional heart care, our providers are all part of one team.  This team includes your primary Cardiologist (physician) and Advanced Practice Providers or APPs (Physician Assistants and Nurse Practitioners) who all work together to provide you with the care you need, when you need it.  Your next appointment:   1 year(s)  Provider:   Arnoldo Lapping, MD           1st Floor: - Lobby - Registration  - Pharmacy  - Lab - Cafe  2nd Floor: - PV Lab - Diagnostic Testing (echo, CT, nuclear med)  3rd Floor: - Vacant  4th Floor: - TCTS (cardiothoracic surgery) - AFib Clinic - Structural Heart Clinic - Vascular Surgery  - Vascular Ultrasound  5th Floor: - HeartCare Cardiology (general and EP) - Clinical Pharmacy for coumadin, hypertension, lipid, weight-loss medications, and med management appointments    Valet parking services will be available as well.

## 2023-09-16 ENCOUNTER — Encounter: Payer: Self-pay | Admitting: Cardiovascular Disease

## 2023-09-16 DIAGNOSIS — E782 Mixed hyperlipidemia: Secondary | ICD-10-CM

## 2023-09-16 DIAGNOSIS — E7841 Elevated Lipoprotein(a): Secondary | ICD-10-CM

## 2023-09-16 DIAGNOSIS — R931 Abnormal findings on diagnostic imaging of heart and coronary circulation: Secondary | ICD-10-CM

## 2023-09-16 DIAGNOSIS — E559 Vitamin D deficiency, unspecified: Secondary | ICD-10-CM

## 2023-09-19 ENCOUNTER — Telehealth: Payer: Self-pay | Admitting: Cardiovascular Disease

## 2023-09-19 NOTE — Telephone Encounter (Signed)
 Spoke to patient he stated he sent labs this past Tuesday through mychart for Dr.Cooper to review.He would like to be notified after Dr.Cooper reviews.

## 2023-09-19 NOTE — Telephone Encounter (Signed)
 Patient was calling to get the update on the requested                                                                                                                                                                                                                                                                                                                                                                                                                                                      blood work that was discussed on 4/21 visit

## 2023-09-22 ENCOUNTER — Telehealth: Payer: Self-pay | Admitting: Sports Medicine

## 2023-09-22 NOTE — Telephone Encounter (Signed)
 Pt and his wife, Nellie Banas wants to establish care with you. They were referred my Dr. Lovenia Ruby. States that Dr Lovenia Ruby is in "Semi retirement".

## 2023-09-22 NOTE — Telephone Encounter (Signed)
 I think for primary care but I can do orthopedic/musculoskeletal stuff for them.  We do have another provider starting in June who would be able to accommodate primary care.

## 2023-09-22 NOTE — Telephone Encounter (Signed)
Completed via MyChart encounter. 

## 2023-09-24 NOTE — Addendum Note (Signed)
 Addended by: Keller Patella on: 09/24/2023 04:00 PM   Modules accepted: Orders

## 2023-09-25 LAB — LIPID PANEL

## 2023-09-26 LAB — COMPREHENSIVE METABOLIC PANEL WITH GFR
ALT: 22 IU/L (ref 0–44)
AST: 21 IU/L (ref 0–40)
Albumin: 4.7 g/dL (ref 3.8–4.9)
Alkaline Phosphatase: 57 IU/L (ref 44–121)
BUN/Creatinine Ratio: 23 — ABNORMAL HIGH (ref 9–20)
BUN: 24 mg/dL (ref 6–24)
Bilirubin Total: 0.9 mg/dL (ref 0.0–1.2)
CO2: 25 mmol/L (ref 20–29)
Calcium: 9.3 mg/dL (ref 8.7–10.2)
Chloride: 100 mmol/L (ref 96–106)
Creatinine, Ser: 1.05 mg/dL (ref 0.76–1.27)
Globulin, Total: 1.8 g/dL (ref 1.5–4.5)
Glucose: 88 mg/dL (ref 70–99)
Potassium: 4.2 mmol/L (ref 3.5–5.2)
Sodium: 139 mmol/L (ref 134–144)
Total Protein: 6.5 g/dL (ref 6.0–8.5)
eGFR: 85 mL/min/{1.73_m2} (ref >59)

## 2023-09-26 LAB — LIPID PANEL
Chol/HDL Ratio: 2.6 ratio (ref 0.0–5.0)
Cholesterol, Total: 200 mg/dL — ABNORMAL HIGH (ref 100–199)
HDL: 78 mg/dL (ref >39)
LDL Chol Calc (NIH): 113 mg/dL — ABNORMAL HIGH (ref 0–99)
Triglycerides: 48 mg/dL (ref 0–149)
VLDL Cholesterol Cal: 9 mg/dL (ref 5–40)

## 2023-09-26 LAB — NMR, LIPOPROFILE
Cholesterol, Total: 201 mg/dL — ABNORMAL HIGH (ref 100–199)
HDL Particle Number: 36 umol/L (ref >=30.5)
HDL-C: 87 mg/dL (ref >39)
LDL Particle Number: 991 nmol/L (ref <1000)
LDL Size: 21.4 nm (ref >20.5)
LDL-C (NIH Calc): 105 mg/dL — ABNORMAL HIGH (ref 0–99)
LP-IR Score: <25 (ref <=45)
Small LDL Particle Number: <90 nmol/L (ref <=527)
Triglycerides: 49 mg/dL (ref 0–149)

## 2023-09-26 LAB — MICROALBUMIN / CREATININE URINE RATIO
Creatinine, Urine: 51 mg/dL
Microalb/Creat Ratio: <6 mg/g{creat} (ref 0–29)
Microalbumin, Urine: <3 ug/mL

## 2023-09-26 LAB — APOLIPOPROTEIN B: Apolipoprotein B: 92 mg/dL — ABNORMAL HIGH (ref <90)

## 2023-09-26 LAB — LIPOPROTEIN A (LPA): Lipoprotein (a): 81.9 nmol/L — ABNORMAL HIGH (ref <75.0)

## 2023-10-04 LAB — HIGH SENSITIVITY CRP: CRP, High Sensitivity: 0.26 mg/L (ref 0.00–3.00)

## 2023-10-04 LAB — HOMOCYSTEINE: Homocysteine: 7.8 umol/L (ref 0.0–14.5)

## 2023-10-04 LAB — URIC ACID: Uric Acid: 4.6 mg/dL (ref 3.8–8.4)

## 2023-10-04 LAB — CYSTATIN C: CYSTATIN C: 0.73 mg/L (ref 0.67–1.14)

## 2023-10-04 LAB — VITAMIN D, 25-HYDROXY, TOTAL: Vitamin D, 25-Hydroxy, Serum: 49 ng/mL

## 2023-10-07 ENCOUNTER — Encounter: Payer: Self-pay | Admitting: Cardiovascular Disease

## 2023-10-07 ENCOUNTER — Ambulatory Visit (HOSPITAL_COMMUNITY)
Admission: RE | Admit: 2023-10-07 | Discharge: 2023-10-07 | Disposition: A | Discharge status: Home / Self Care | Source: Ambulatory Visit | Attending: Cardiovascular Disease | Admitting: Cardiovascular Disease

## 2023-10-07 ENCOUNTER — Ambulatory Visit (HOSPITAL_COMMUNITY)
Admission: RE | Admit: 2023-10-07 | Discharge: 2023-10-07 | Disposition: A | Discharge status: Home / Self Care | Payer: Self-pay | Source: Ambulatory Visit | Attending: Cardiovascular Disease | Admitting: Cardiovascular Disease

## 2023-10-07 DIAGNOSIS — R931 Abnormal findings on diagnostic imaging of heart and coronary circulation: Secondary | ICD-10-CM | POA: Diagnosis not present

## 2023-10-07 DIAGNOSIS — E7841 Elevated Lipoprotein(a): Secondary | ICD-10-CM | POA: Insufficient documentation

## 2023-10-07 DIAGNOSIS — R42 Dizziness and giddiness: Secondary | ICD-10-CM | POA: Insufficient documentation

## 2023-10-07 DIAGNOSIS — Q2112 Patent foramen ovale: Secondary | ICD-10-CM | POA: Insufficient documentation

## 2023-10-10 LAB — CYSTATIN C WITH GLOMERULAR FILTRATION RATE, ESTIMATED (EGFR)
CYSTATIN C: 0.74 mg/L (ref 0.52–1.23)
eGFR: 112 mL/min/{1.73_m2} (ref >=60)

## 2023-10-17 ENCOUNTER — Ambulatory Visit: Payer: Self-pay | Admitting: Cardiovascular Disease

## 2023-10-17 NOTE — Telephone Encounter (Signed)
 Lengthy phone conversation with the patient about all of his recent testing. He has a Coronary Ca score placing him in the 75th percentile and LDL of 113. Mildly elevated lipo B. Discussed statin initiation. He had a lot of questions about statin alternatives like PCSK9's, bempedoic acid, and ezetimibe. After a shared decision making conversation, he decided to pursue [facility] testing and will get back to me about what he would like to do. I would start him on crestor 10 mg and repeat a Lipomed panel in 3 months if he is agreeable to that. If not, I would send him to Lipid Clinic for further options. He will let us  know what he decides.

## 2024-01-27 ENCOUNTER — Encounter: Payer: Self-pay | Admitting: Sports Medicine

## 2024-02-11 ENCOUNTER — Encounter: Payer: Self-pay | Admitting: Sports Medicine

## 2024-02-11 ENCOUNTER — Ambulatory Visit (INDEPENDENT_AMBULATORY_CARE_PROVIDER_SITE_OTHER): Admitting: Sports Medicine

## 2024-02-11 VITALS — BP 110/74 | Ht 73.0 in | Wt 174.0 lb

## 2024-02-11 DIAGNOSIS — M25562 Pain in left knee: Secondary | ICD-10-CM | POA: Diagnosis not present

## 2024-02-11 NOTE — Progress Notes (Signed)
   Subjective:    Patient ID: Dylan Rosario, male    DOB: January 09, 1971, 53 y.o.   MRN: [id number]  HPI chief complaint: Left knee pain  Dylan Rosario presents today with 1 day of left knee pain.  He initially noticed some discomfort when doing a dumbbell row exercise.  His left knee was on a weight bench and was laying awkwardly between a couple of pads when he first noticed the pain.  He was able to continue with his workout but began to experience significant burning pain last night.  Pain was severe enough that it disrupted his sleep.  He localizes it to the medial knee just medial to the patellar tendon.  He did not notice any swelling.  No significant knee problems in the past.   Review of Systems As above    Objective:   Physical Exam  Well-developed, fit appearing.  No acute distress  Left knee: Full range of motion.  No effusion.  No significant tenderness to palpation along the medial joint line.  Point of maximum tenderness is just lateral to the medial joint line in the area of Hoffa's fat pad.  No tenderness along the lateral joint line.  Knee is grossly stable to ligamentous exam.  Minimal pain with Thessaly's testing.  MSK ultrasound of the left knee was performed.  There is a trace amount of fluid in the suprapatellar pouch but this may be physiologic.  Visualized portion of the medial meniscus and medial joint line are unremarkable.  No evidence of increased Doppler activity over any of the bony structures of the knee medially.      Assessment & Plan:   Acute onset left knee pain  Ultrasound is reassuring.  Dylan Rosario may have just simply bruised an area of Hoffa's fat pad when he had his left knee awkwardly on the weight bench.  I think he is okay to continue with his normal weekly workout routine using pain as his guide.  He will be a little extra cautious on lower body and leg work.  I also recommend that he avoid placing his left knee directly on a weight bench or the floor  until he is completely pain-free.  He will follow-up for ongoing or recalcitrant issues.  This note was dictated using Dragon naturally speaking software and may contain errors in syntax, spelling, or content which have not been identified prior to signing this note.
